# Patient Record
Sex: Female | Born: 1985 | Race: White | Hispanic: No | Marital: Married | Smoking: Never smoker
Health system: Southern US, Community
[De-identification: ages and names within clinical notes are randomized; demographics above are authoritative.]

## PROBLEM LIST (undated history)

## (undated) DIAGNOSIS — N921 Excessive and frequent menstruation with irregular cycle: Secondary | ICD-10-CM

## (undated) DIAGNOSIS — K219 Gastro-esophageal reflux disease without esophagitis: Secondary | ICD-10-CM

## (undated) DIAGNOSIS — J45909 Unspecified asthma, uncomplicated: Secondary | ICD-10-CM

## (undated) DIAGNOSIS — N926 Irregular menstruation, unspecified: Secondary | ICD-10-CM

## (undated) DIAGNOSIS — Z8619 Personal history of other infectious and parasitic diseases: Secondary | ICD-10-CM

## (undated) HISTORY — PX: DILATION AND CURETTAGE OF UTERUS: SHX78

## (undated) HISTORY — PX: TONSILLECTOMY AND ADENOIDECTOMY: SHX28

## (undated) HISTORY — DX: Personal history of other infectious and parasitic diseases: Z86.19

## (undated) HISTORY — PX: UPPER GI ENDOSCOPY: SHX6162

## (undated) HISTORY — DX: Irregular menstruation, unspecified: N92.6

## (undated) HISTORY — DX: Excessive and frequent menstruation with irregular cycle: N92.1

## (undated) HISTORY — PX: TONSILLECTOMY: SUR1361

---
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
## 2013-05-06 ENCOUNTER — Ambulatory Visit (INDEPENDENT_AMBULATORY_CARE_PROVIDER_SITE_OTHER): Payer: BC Managed Care – PPO | Admitting: Gynecology

## 2013-05-06 ENCOUNTER — Encounter: Payer: Self-pay | Admitting: Gynecology

## 2013-05-06 VITALS — BP 94/60 | Resp 14 | Ht 64.0 in | Wt 126.0 lb

## 2013-05-06 DIAGNOSIS — Z01419 Encounter for gynecological examination (general) (routine) without abnormal findings: Secondary | ICD-10-CM

## 2013-05-06 DIAGNOSIS — Z124 Encounter for screening for malignant neoplasm of cervix: Secondary | ICD-10-CM

## 2013-05-06 DIAGNOSIS — Z0000 Encounter for general adult medical examination without abnormal findings: Secondary | ICD-10-CM

## 2013-05-06 DIAGNOSIS — N926 Irregular menstruation, unspecified: Secondary | ICD-10-CM

## 2013-05-06 HISTORY — DX: Irregular menstruation, unspecified: N92.6

## 2013-05-06 LAB — POCT URINALYSIS DIPSTICK
Leukocytes, UA: NEGATIVE
RBC UA: 2
Urobilinogen, UA: NEGATIVE
pH, UA: 5

## 2013-05-06 LAB — HEMOGLOBIN, FINGERSTICK: Hemoglobin, fingerstick: 12.5 g/dL (ref 12.0–16.0)

## 2013-05-06 LAB — TSH: TSH: 1.854 u[IU]/mL (ref 0.350–4.500)

## 2013-05-06 NOTE — Patient Instructions (Signed)

## 2013-05-06 NOTE — Progress Notes (Signed)
28 y.o. Married Caucasian female   [id number] here for annual exam. Pt is  currently sexually active.  She reports not using contraception..  First sexual activity at 28 years old, 3 number of lifetime partners.   Pt has been married for 4314m, they would like to conceive in the upcoming months.  Pt reports that her cycles are irratic with either short cycles or break thru bleeding-range is 12-34 days, bleeding is 5d super tampon every 2.5h for 1d, regular for 5h for next 4d.  No clots.  Pt was on DepoProvera for 1530m, last shot 9/13.    Patient's last menstrual period was 05/05/2013.          Sexually active: yes  The current method of family planning is none.    Exercising: no  The patient does not participate in regular exercise at present. Last pap:  More than 2 years WNL  Alcohol:  Maybe 1/wk  Tobacco: no Drugs: no Gardisil: no, completed:  Hgb: 12.5 ; Urine: Blood 2 (menses) , Trace Protein   No health maintenance topics applied.  Family History  Problem Relation Age of Onset  . Melanoma Maternal Grandfather   . Skin cancer Paternal Grandfather   . Skin cancer Paternal Grandmother   . Breast cancer Maternal Aunt 54  . Diabetes Mellitus II Mother   . Fibroids Mother   . Hypertension Mother   . Diabetes Mellitus II Maternal Grandmother   . Heart attack Maternal Grandmother   . Osteoarthritis Maternal Grandmother   . Diabetes Mellitus I Paternal Aunt     There are no active problems to display for this patient.   History reviewed. No pertinent past medical history.  Past Surgical History  Procedure Laterality Date  . Tonsillectomy and adenoidectomy  Age 70   . Wisdom tooth extraction  2003    Allergies: Review of patient's allergies indicates no known allergies.  Current Outpatient Prescriptions  Medication Sig Dispense Refill  . Acetaminophen (TYLENOL PO) Take by mouth as needed.       No current facility-administered medications for this visit.    ROS: Pertinent items  are noted in HPI.  Exam:    BP 94/60  Resp 14  Ht 5\' 4"  (1.626 m)  Wt 126 lb (57.153 kg)  BMI 21.62 kg/m2  LMP 05/05/2013 Weight change: @WEIGHTCHANGE @ Last 3 height recordings:  Ht Readings from Last 3 Encounters:  05/06/13 5\' 4"  (1.626 m)   General appearance: alert, cooperative and appears stated age Head: Normocephalic, without obvious abnormality, atraumatic Neck: no adenopathy, no carotid bruit, no JVD, supple, symmetrical, trachea midline and thyroid not enlarged, symmetric, no tenderness/mass/nodules Lungs: clear to auscultation bilaterally Breasts: normal appearance, no masses or tenderness Heart: regular rate and rhythm, S1, S2 normal, no murmur, click, rub or gallop Abdomen: soft, non-tender; bowel sounds normal; no masses,  no organomegaly Extremities: extremities normal, atraumatic, no cyanosis or edema Skin: Skin color, texture, turgor normal. No rashes or lesions Lymph nodes: Cervical, supraclavicular, and axillary nodes normal. no inguinal nodes palpated Neurologic: Grossly normal   Pelvic: External genitalia:  no lesions              Urethra: normal appearing urethra with no masses, tenderness or lesions              Bartholins and Skenes: Bartholin's, Urethra, Skene's normal                 Vagina: heavy menstrum  Cervix: normal appearance              Pap taken: yes        Bimanual Exam:  Uterus:  uterus is normal size, shape, consistency and nontender                                      Adnexa:    normal adnexa in size, nontender and no masses                                      Rectovaginal: Confirms                                      Anus:  normal sphincter tone, no lesions  A: well woman Irregular menses     P: pap smear with reflex Discussed at length causes of irregular menses, pt had irregluar cycles before depo.  Discussed PCOS and treatment, she is day 3 so we will check FSH/LH ratio and if abnormal will get fasting insulin and  glucose.  TSH also done.  Pt is not interested in actively pursuing pregnancy att his time counseled on breast self exam, adequate intake of calcium and vitamin D, diet and exercise return annually or prn Discussed STD prevention, regular condom use.     An After Visit Summary was printed and given to the patient.

## 2013-05-07 ENCOUNTER — Telehealth: Payer: Self-pay | Admitting: *Deleted

## 2013-05-07 LAB — FSH/LH
FSH: 5.6 m[IU]/mL
LH: 3.8 m[IU]/mL

## 2013-05-07 LAB — RUBELLA SCREEN: RUBELLA: 1.65 {index} — AB (ref <0.90)

## 2013-05-07 NOTE — Telephone Encounter (Signed)
Message copied by Lorraine LaxSHAW, Naureen Benton J on Wed May 07, 2013 12:21 PM ------      Message from: Douglass RiversLATHROP, TRACY      Created: Wed May 07, 2013 11:10 AM       Rubella is immune, thyroid normal, FSH/LH ratio is good and not c/w PCOS, sent mychart.      Pt should make appt to discuss management of irregular cycles, pls call ------

## 2013-05-07 NOTE — Telephone Encounter (Signed)
Patient notified see result note 

## 2013-05-07 NOTE — Telephone Encounter (Signed)
Left Message To Call Back  

## 2013-05-07 NOTE — Telephone Encounter (Signed)
Return call to Jasmine. °

## 2013-05-08 LAB — IPS PAP TEST WITH REFLEX TO HPV

## 2013-05-14 ENCOUNTER — Ambulatory Visit (INDEPENDENT_AMBULATORY_CARE_PROVIDER_SITE_OTHER): Payer: BC Managed Care – PPO | Admitting: Gynecology

## 2013-05-14 ENCOUNTER — Encounter: Payer: Self-pay | Admitting: Gynecology

## 2013-05-14 VITALS — BP 100/68 | Resp 18 | Ht 64.0 in | Wt 125.0 lb

## 2013-05-14 DIAGNOSIS — N926 Irregular menstruation, unspecified: Secondary | ICD-10-CM

## 2013-05-14 DIAGNOSIS — N921 Excessive and frequent menstruation with irregular cycle: Secondary | ICD-10-CM

## 2013-05-14 DIAGNOSIS — N920 Excessive and frequent menstruation with regular cycle: Secondary | ICD-10-CM

## 2013-05-14 NOTE — Progress Notes (Signed)
Pt here to discuss treatment and further evaluation of her irregular cycles.  Pt varified that she can bleed irratically every 13-38d.  She can bleed heavy changing tampons every hour to spotting.  She is unable to plan activities due to their irregularity. Pt had FSH/LH  And TSH done day 3 which were normal ratio.  She has been off Depo for over 1y and.  Pt is not interested in conception now but she is recently married.   We offered her ocp to regulate with the idea that she may have a normal cycle after coming off for a few months or can be stimulated if need be.  pt had been on ocp in past but was not interested in restarting at this time. We suggested proceeding with an u/s and SHG to rule out any anatomic defects that may be contributing-she is agreeable Pt denies any other issues with bleeding such as bleeding gums, bruising or slow to clot after cuts. Questions addressed 5141m spent discussing menometrrrhagia, >50% face to face

## 2013-05-15 ENCOUNTER — Telehealth: Payer: Self-pay | Admitting: Gynecology

## 2013-05-15 NOTE — Telephone Encounter (Signed)
Spoke with patient. Advised [facility]. Scheduled [facility].Marland Kitchen.patient is going to confirm with her employer that the appointment is ok and will call back tomorrow if anything changes.

## 2013-05-15 NOTE — Telephone Encounter (Signed)
Left message for patient to call back. Need to go over benefits and schedule [facility]GM. °

## 2013-05-15 NOTE — Telephone Encounter (Signed)
Patient returning Kristina [name]'s call but will try back at 4 PM.

## 2013-05-19 NOTE — Telephone Encounter (Signed)
[facility]. Asked patient to call back to reschedule.

## 2013-05-19 NOTE — Telephone Encounter (Signed)
Sp[facility] with patient. [facility]

## 2013-05-20 ENCOUNTER — Other Ambulatory Visit: Payer: BC Managed Care – PPO

## 2013-05-20 ENCOUNTER — Other Ambulatory Visit: Payer: BC Managed Care – PPO | Admitting: Gynecology

## 2013-05-27 ENCOUNTER — Other Ambulatory Visit: Payer: Self-pay | Admitting: Gynecology

## 2013-05-27 ENCOUNTER — Encounter: Payer: Self-pay | Admitting: Gynecology

## 2013-05-27 ENCOUNTER — Ambulatory Visit (INDEPENDENT_AMBULATORY_CARE_PROVIDER_SITE_OTHER): Payer: BC Managed Care – PPO | Admitting: Gynecology

## 2013-05-27 ENCOUNTER — Ambulatory Visit (INDEPENDENT_AMBULATORY_CARE_PROVIDER_SITE_OTHER): Payer: BC Managed Care – PPO

## 2013-05-27 VITALS — BP 92/60 | HR 64 | Resp 16 | Ht 64.0 in | Wt 125.0 lb

## 2013-05-27 DIAGNOSIS — N926 Irregular menstruation, unspecified: Secondary | ICD-10-CM

## 2013-05-27 DIAGNOSIS — N921 Excessive and frequent menstruation with irregular cycle: Secondary | ICD-10-CM

## 2013-05-27 DIAGNOSIS — N920 Excessive and frequent menstruation with regular cycle: Secondary | ICD-10-CM

## 2013-05-27 NOTE — Progress Notes (Signed)
     Pt here for PUS and SHG to evaluate for menorrhagia.  Pt is day 23 of cycle and is not using contraception.  Therefore SHG not done today, explained to pt who is agreeable.  Images reviewed.  Uterus is normal, no fibroids noted.  EMS 6.2, smooth without any distortion.  There is a dominant appearing follicle on the left at 17mm and right is normal.  No free fliud. Suggest when she and husband are ready that we stimulate with clomid.  I would suggest they return as a couple to review.  She is agreeable.  She is not interested in try ocp in the meantime. 4482m spent discussing menorrhagia and fertility, >50% face to face

## 2013-06-29 ENCOUNTER — Encounter: Payer: Self-pay | Admitting: Gynecology

## 2013-06-30 NOTE — Telephone Encounter (Signed)
You should come in for consult, if possible with your husband, when was your last cycle?  Do you remember when you bled in April, was looking at u/s and there was a follicle.Marland Kitchen

## 2013-07-02 ENCOUNTER — Other Ambulatory Visit: Payer: Self-pay | Admitting: Gynecology

## 2013-07-02 DIAGNOSIS — N926 Irregular menstruation, unspecified: Secondary | ICD-10-CM

## 2013-07-16 ENCOUNTER — Ambulatory Visit (INDEPENDENT_AMBULATORY_CARE_PROVIDER_SITE_OTHER): Payer: BC Managed Care – PPO | Admitting: Gynecology

## 2013-07-16 VITALS — BP 100/60 | Resp 14 | Ht 64.0 in | Wt 124.0 lb

## 2013-07-16 DIAGNOSIS — N970 Female infertility associated with anovulation: Secondary | ICD-10-CM

## 2013-07-16 MED ORDER — CLOMIPHENE CITRATE 50 MG PO TABS: 50.0000 mg | ORAL_TABLET | Freq: Every day | ORAL | Status: DC

## 2013-07-16 NOTE — Patient Instructions (Signed)
Clomid day 5-9/ cycle Expect ovulation d16-21 menses day 30-35 Sex every 2-3d Avoid alcohol, hot tubs, NSAIDS

## 2013-07-16 NOTE — Progress Notes (Signed)
Pt comes in to discuss getting pregnant.  Pt has had normal day 3 FSH/LH, TSH and a normal PUS.  She did not have a progesterone level done, or an EMB as her PUS was day 23 and no contraception. Progesterone to be timed with flow. Pt was asked to bring in her most recent cycles for charting. Pt reports cycle irregularity with variable flow every 12-34d.   Review of cycles since January shows flow 5-8d, menses 14-35d, occasional mid-cycle spotting at20d  Husband: 30yo, HTN 15y-benapril/HCTZ  Hypothyroid: 12y- synthroid 100mcg  DM: just dx- metformin 1000mg  BID  No children/pregnancy  No issues with ED   We discussed stimulating ovulation with clomid, instructed how to time cycle to take, coital frequency, timing of ovulation. Pt and husband will not be together most of July, will start in August. Clomid  #5, no refill given Instructed to call with cycle or if no cycle, pregnancy test at d35 Questions addressed 7678m spent counseling, >50%  Face to face

## 2013-08-07 ENCOUNTER — Telehealth: Payer: Self-pay | Admitting: Gynecology

## 2013-08-07 LAB — US OB COMP LESS 14 WKS

## 2013-08-07 NOTE — Telephone Encounter (Signed)
Patient would like an appointment to confirm pregnancy.

## 2013-08-07 NOTE — Telephone Encounter (Signed)
OV with Dr lathrop week of 08-25-13 is not in place of the recommendation for evaluation now as recommended by Dr Edward JollySilva. Patient should be seen for evaluation, if cant come in, ok to be seen locally and then we are happy to see her the week of 08-25-13 when [place] is back in [place].

## 2013-08-07 NOTE — Telephone Encounter (Signed)
Spoke with patient. Advised of message as seen below from Dr.Silva. Patient states that [place] in [place] and if she leaves she will not get credit and she already paid for the class. Advised patient that I understand but her health is very important and this is something that needs to be assessed. Advised this needs to be assessed today. Patient is agreeable. "I just don't think that I could make it there before you close today or tomorrow." Advised patient needs to be seen today at local ER. Patient is agreeable.  Advised this is something that she should not let go without being seen. Patient states that she will be seen today locally.   Routing to Dr.Silva as covering Cc: Dr.Lathrop  Will keep in inbox to check on patient tomorrow to make sure she was seen.

## 2013-08-07 NOTE — Telephone Encounter (Signed)
Spoke with patient. Patient would like to come in to confirm pregnancy. Took pregnancy test yesterday that was positive.Patient requesting appointment for the week of July 27th as she is in school. Advised patient Dr.Lathrop is out of the office that week but could get her in to see another provider. Patient is agreeable. Appointment scheduled for July 27th at 9:15am with Verner Choleborah S. Leonard CNM. Patient agreeable to date and time. Patient states that she has been having "cramping" that has been going on for a while "Its been going on since even before I thought I could be pregnant." Cramping is central in the abdominal area. Patient denies sharp pain and bleeding."It is a cramping that just happens occasionally and then goes away." Advised patient would send a message over to the covering provider as Dr.Lathrop is off today and would give patient a call back with further recommendations. Patient agreeable.  Dr.Silva, patient has been on Clomid 50mg  and was last seen by Dr.Lathrop on 6/17 see encounter. Please advise.  Cc:Dr.Lathrop

## 2013-08-07 NOTE — Telephone Encounter (Signed)
Dr Farrel GobbleLathrop called in, would like to see patient the week of 7-27 if possible.  Will try to schedule appointment on  08-26-13. Will know about appointment date when we check on patient tomorrow.

## 2013-08-07 NOTE — Telephone Encounter (Signed)
I agree with the plan.

## 2013-08-07 NOTE — Telephone Encounter (Signed)
Patient needs appointment and possible ultrasound. Please work her in for today.

## 2013-08-08 NOTE — Telephone Encounter (Signed)
Spoke with patient. Patient states that she was seen yesterday for an ultrasound and blood work and everything was okay. Will have ultrasound and blood work sent to Dr.Lathrop for review. Appointment scheduled with Dr.Lathrop for 7/28 at 10:30am. Agreeable to date and time.  Routing to provider for final review. Patient agreeable to disposition. Will close encounter

## 2013-08-19 ENCOUNTER — Telehealth: Payer: Self-pay | Admitting: *Deleted

## 2013-08-19 DIAGNOSIS — N912 Amenorrhea, unspecified: Secondary | ICD-10-CM

## 2013-08-19 NOTE — Telephone Encounter (Signed)
Call to patient to adjust appointment time to afternoon appointment so that ulltrasound is available if needed. Appt with Kristina [name] at 12 and possible PUS 1230.  Routing to provider for final review. Patient agreeable to disposition. Will close encounter

## 2013-08-25 ENCOUNTER — Ambulatory Visit: Payer: BC Managed Care – PPO | Admitting: Certified Nurse Midwife

## 2013-08-26 ENCOUNTER — Other Ambulatory Visit: Payer: Self-pay | Admitting: Gynecology

## 2013-08-26 ENCOUNTER — Ambulatory Visit: Payer: BC Managed Care – PPO | Admitting: Gynecology

## 2013-08-26 ENCOUNTER — Encounter: Payer: Self-pay | Admitting: Gynecology

## 2013-08-26 ENCOUNTER — Ambulatory Visit (INDEPENDENT_AMBULATORY_CARE_PROVIDER_SITE_OTHER): Payer: BC Managed Care – PPO | Admitting: Gynecology

## 2013-08-26 ENCOUNTER — Ambulatory Visit (INDEPENDENT_AMBULATORY_CARE_PROVIDER_SITE_OTHER): Payer: BC Managed Care – PPO

## 2013-08-26 VITALS — BP 124/62 | HR 60 | Ht 64.0 in | Wt 125.0 lb

## 2013-08-26 DIAGNOSIS — N912 Amenorrhea, unspecified: Secondary | ICD-10-CM

## 2013-08-26 DIAGNOSIS — O3680X0 Pregnancy with inconclusive fetal viability, not applicable or unspecified: Secondary | ICD-10-CM

## 2013-08-26 DIAGNOSIS — O021 Missed abortion: Secondary | ICD-10-CM

## 2013-08-26 LAB — US OB TRANSVAGINAL

## 2013-08-26 LAB — POCT URINE PREGNANCY: Preg Test, Ur: POSITIVE

## 2013-08-26 NOTE — Patient Instructions (Signed)
Incomplete Miscarriage A miscarriage is the sudden loss of an unborn baby (fetus) before the 20th week of pregnancy. In an incomplete miscarriage, parts of the fetus or placenta (afterbirth) remain in the body.  Having a miscarriage can be an emotional experience. Talk with your health care provider about any questions you may have about miscarrying, the grieving process, and your future pregnancy plans. CAUSES   Problems with the fetal chromosomes that make it impossible for the baby to develop normally. Problems with the baby's genes or chromosomes are most often the result of errors that occur by chance as the embryo divides and grows. The problems are not inherited from the parents.  Infection of the cervix or uterus.  Hormone problems.  Problems with the cervix, such as having an incompetent cervix. This is when the tissue in the cervix is not strong enough to hold the pregnancy.  Problems with the uterus, such as an abnormally shaped uterus, uterine fibroids, or congenital abnormalities.  Certain medical conditions.  Smoking, drinking alcohol, or taking illegal drugs.  Trauma. SYMPTOMS   Vaginal bleeding or spotting, with or without cramps or pain.  Pain or cramping in the abdomen or lower back.  Passing fluid, tissue, or blood clots from the vagina. DIAGNOSIS  Your health care provider will perform a physical exam. You may also have an ultrasound to confirm the miscarriage. Blood or urine tests may also be ordered. TREATMENT   Usually, a dilation and curettage (D&C) procedure is performed. During a D&C procedure, the cervix is widened (dilated) and any remaining fetal or placental tissue is gently removed from the uterus.  Antibiotic medicines are prescribed if there is an infection. Other medicines may be given to reduce the size of the uterus (contract) if there is a lot of bleeding.  If you have Rh negative blood and your baby was Rh positive, you will need a Rho (D)  immune globulin shot. This shot will protect any future baby from having Rh blood problems in future pregnancies.  You may be confined to bed rest. This means you should stay in bed and only get up to use the bathroom. HOME CARE INSTRUCTIONS   Rest as directed by your health care provider.  Restrict activity as directed by your health care provider. You may be allowed to continue light activity if curettage was not done but you require further treatment.  Keep track of the number of pads you use each day. Keep track of how soaked (saturated) they are. Record this information.  Do not  use tampons.  Do not douche or have sexual intercourse until approved by your health care provider.  Keep all follow-up appointments for reevaluation and continuing management.  Only take over-the-counter or prescription medicines for pain, fever, or discomfort as directed by your health care provider.  Take antibiotic medicine as directed by your health care provider. Make sure you finish it even if you start to feel better. SEEK IMMEDIATE MEDICAL CARE IF:   You experience severe cramps in your stomach, back, or abdomen.  You have an unexplained temperature (make sure to record these temperatures).  You pass large clots or tissue (save these for your health care provider to inspect).  Your bleeding increases.  You become light-headed, weak, or have fainting episodes. MAKE SURE YOU:   Understand these instructions.  Will watch your condition.  Will get help right away if you are not doing well or get worse. Document Released: 01/16/2005 Document Revised: 06/02/2013 Document Reviewed:   08/15/2012 ExitCare Patient Information 2015 [employer]. This information is not intended to replace advice given to you by your health care provider. Make sure you discuss any questions you have with your health care provider.  

## 2013-08-26 NOTE — Progress Notes (Signed)
Pt here after spontaneously conceiving,   [place] was [place] in [place], after she had some cramping and had a viability u/s where a yolk sca but no fetal heart was  Noted. No bleeding, cramping improved, some nausea no vomiting.no bleeding. Pt and husband are pleased. LMP: 07/21/13     PUS images reviewed with pt and husband.  Despite normal appearing GS, yolk sac and s=d, no fetal heart tones were seen on today's exam despite multiple attempts. Both are visibly upset. Discussed miscarriage frequency, etiology and likelihood to recur. Discussed suspected proximity of fetal loss to PUS.  Encourage them to grieve and when ready can use clomid to attempt pregnancy in future.  discussed D&C with suction vs expectant management and risks of bleeding, cervical damage and uterine perforation were reviewed. They are unsure about what they want to do, we will contact them later this week  Questions addressed Support given ABO Rh, BHCG today 226m spent counseling, >50% face to face

## 2013-08-27 ENCOUNTER — Telehealth: Payer: Self-pay | Admitting: Gynecology

## 2013-08-27 ENCOUNTER — Other Ambulatory Visit (INDEPENDENT_AMBULATORY_CARE_PROVIDER_SITE_OTHER): Payer: BC Managed Care – PPO

## 2013-08-27 DIAGNOSIS — O021 Missed abortion: Secondary | ICD-10-CM

## 2013-08-27 DIAGNOSIS — N926 Irregular menstruation, unspecified: Secondary | ICD-10-CM

## 2013-08-27 LAB — ABO AND RH: Rh Type: POSITIVE

## 2013-08-27 LAB — HCG, QUANTITATIVE, PREGNANCY: hCG, Beta Chain, Quant, S: 21176.8 m[IU]/mL

## 2013-08-27 NOTE — Addendum Note (Signed)
Addended by: Lorraine LaxSHAW, Ehan Freas J on: 08/27/2013 10:03 AM   Modules accepted: Orders

## 2013-08-27 NOTE — Telephone Encounter (Signed)
Pt informed regarding labs, +rh, BHCG 21k, pt denies any bleeding or cramping.  Pt would like a repeat hcg to see the trend before abandoning this pregnancy.  Will drop order. Pt undecided re D&C at this time

## 2013-08-28 ENCOUNTER — Telehealth: Payer: Self-pay | Admitting: Gynecology

## 2013-08-28 LAB — HCG, QUANTITATIVE, PREGNANCY: hCG, Beta Chain, Quant, S: 20294.5 m[IU]/mL

## 2013-08-28 NOTE — Telephone Encounter (Signed)
Pt informed that HCG is declining c/w MAB.  They are unsure if they want to have a D&C or not, Pt states that she noticed a small amount of dark blood without cramping today.  She was advised to call if she has bleeding and/or cramping.   Questions addressed.

## 2013-08-29 ENCOUNTER — Telehealth: Payer: Self-pay | Admitting: Gynecology

## 2013-08-29 ENCOUNTER — Encounter: Payer: Self-pay | Admitting: Emergency Medicine

## 2013-08-29 ENCOUNTER — Encounter (HOSPITAL_COMMUNITY): Payer: Self-pay

## 2013-08-29 MED ORDER — MISOPROSTOL 200 MCG PO TABS: ORAL_TABLET | ORAL | Status: DC

## 2013-08-29 NOTE — Telephone Encounter (Signed)
Patient calling to speak with nurse about, "What I have decided in terms of miscarriage."

## 2013-08-29 NOTE — Telephone Encounter (Signed)
Spoke with patient. Patient states that she would like to have a D&C and was calling to let Dr.Lathrop know. Advised I would let Dr.Lathrop know and that someone in the office would be in contact with her in regards of setting that up. Patient is agreeable.

## 2013-08-29 NOTE — Telephone Encounter (Signed)
Surgery Information Form  Your surgery is scheduled at: [facility] Date: 09/01/13  Time: 2:45  Arrive at 1315     You can take Tylenol or acetaminophen for any discomfort.  Do not take any  other pain medications unless approved by your physician.  You may continue  your regular multi-vitamin.  3. Please complete the following bowel prep     No bowel prep needed  4. DO NOT EAT OR DRINK ANYTHING AFTER MIDNIGHT ON THE NIGHT   BEFORE YOUR SURGERY (INCLUDING WATER) UNLESS ADVISED  TO DO SO BY THE HOSPITAL OR YOUR PHYSICIAN   5. Dress casually the morning of surgery.  Do not take any valuables with you and   do not wear makeup, jewelry or lotion.  You should shave 48 hours prior to   Surgery. No Jewelry at all.   6. You will have a post operative appointment with your physician   Date: 08/12/13 Time: 1100  7. If you have any questions, please call our office [phone] and ask   for the surgical nurse.   The surgical nurse is available Monday-Friday 9:00 am-5:00 pm

## 2013-09-01 ENCOUNTER — Ambulatory Visit (HOSPITAL_COMMUNITY): Payer: BC Managed Care – PPO | Admitting: Anesthesiology

## 2013-09-01 ENCOUNTER — Encounter (HOSPITAL_COMMUNITY)
Admission: AD | Disposition: A | Discharge status: Home / Self Care | Payer: Self-pay | Source: Ambulatory Visit | Attending: Gynecology

## 2013-09-01 ENCOUNTER — Encounter (HOSPITAL_COMMUNITY): Payer: BC Managed Care – PPO | Admitting: Anesthesiology

## 2013-09-01 ENCOUNTER — Ambulatory Visit (HOSPITAL_COMMUNITY)
Admission: AD | Admit: 2013-09-01 | Discharge: 2013-09-01 | Disposition: A | Discharge status: Home / Self Care | Payer: BC Managed Care – PPO | Source: Ambulatory Visit | Attending: Gynecology | Admitting: Gynecology

## 2013-09-01 ENCOUNTER — Encounter (HOSPITAL_COMMUNITY): Payer: Self-pay | Admitting: *Deleted

## 2013-09-01 DIAGNOSIS — O021 Missed abortion: Secondary | ICD-10-CM | POA: Insufficient documentation

## 2013-09-01 DIAGNOSIS — Z8249 Family history of ischemic heart disease and other diseases of the circulatory system: Secondary | ICD-10-CM | POA: Insufficient documentation

## 2013-09-01 DIAGNOSIS — K219 Gastro-esophageal reflux disease without esophagitis: Secondary | ICD-10-CM | POA: Insufficient documentation

## 2013-09-01 DIAGNOSIS — J45909 Unspecified asthma, uncomplicated: Secondary | ICD-10-CM | POA: Insufficient documentation

## 2013-09-01 DIAGNOSIS — Z833 Family history of diabetes mellitus: Secondary | ICD-10-CM | POA: Insufficient documentation

## 2013-09-01 DIAGNOSIS — N920 Excessive and frequent menstruation with regular cycle: Secondary | ICD-10-CM | POA: Insufficient documentation

## 2013-09-01 DIAGNOSIS — N926 Irregular menstruation, unspecified: Secondary | ICD-10-CM | POA: Insufficient documentation

## 2013-09-01 HISTORY — DX: Gastro-esophageal reflux disease without esophagitis: K21.9

## 2013-09-01 HISTORY — PX: DILATION AND EVACUATION: SHX1459

## 2013-09-01 HISTORY — DX: Unspecified asthma, uncomplicated: J45.909

## 2013-09-01 LAB — CBC
HCT: 34.8 % — ABNORMAL LOW (ref 36.0–46.0)
Hemoglobin: 12.3 g/dL (ref 12.0–15.0)
MCH: 29.9 pg (ref 26.0–34.0)
MCHC: 35.3 g/dL (ref 30.0–36.0)
MCV: 84.5 fL (ref 78.0–100.0)
PLATELETS: 160 10*3/uL (ref 150–400)
RBC: 4.12 MIL/uL (ref 3.87–5.11)
RDW: 12.9 % (ref 11.5–15.5)
WBC: 7.5 10*3/uL (ref 4.0–10.5)

## 2013-09-01 SURGERY — DILATION AND EVACUATION, UTERUS
Anesthesia: Monitor Anesthesia Care | Site: Uterus

## 2013-09-01 MED ORDER — BUPIVACAINE-EPINEPHRINE (PF) 0.25% -1:200000 IJ SOLN
INTRAMUSCULAR | Status: AC
  Filled 2013-09-01: qty 30

## 2013-09-01 MED ORDER — SCOPOLAMINE 1 MG/3DAYS TD PT72
1.0000 | MEDICATED_PATCH | Freq: Once | TRANSDERMAL | Status: DC
  Administered 2013-09-01: 1.5 mg via TRANSDERMAL

## 2013-09-01 MED ORDER — MIDAZOLAM HCL 2 MG/2ML IJ SOLN
INTRAMUSCULAR | Status: DC | PRN
  Administered 2013-09-01: 2 mg via INTRAVENOUS

## 2013-09-01 MED ORDER — BUPIVACAINE-EPINEPHRINE 0.25% -1:200000 IJ SOLN
INTRAMUSCULAR | Status: DC | PRN
  Administered 2013-09-01: 30 mL

## 2013-09-01 MED ORDER — DEXTROSE 5 % IV SOLN
100.0000 mg | Freq: Once | INTRAVENOUS | Status: AC
  Administered 2013-09-01: 100 mg via INTRAVENOUS
  Filled 2013-09-01 (×2): qty 100

## 2013-09-01 MED ORDER — METHYLERGONOVINE MALEATE 0.2 MG/ML IJ SOLN
INTRAMUSCULAR | Status: AC
  Filled 2013-09-01: qty 1

## 2013-09-01 MED ORDER — DEXAMETHASONE SODIUM PHOSPHATE 10 MG/ML IJ SOLN
INTRAMUSCULAR | Status: DC | PRN
  Administered 2013-09-01: 10 mg via INTRAVENOUS

## 2013-09-01 MED ORDER — PROMETHAZINE HCL 25 MG/ML IJ SOLN: 6.2500 mg | INTRAMUSCULAR | Status: DC | PRN

## 2013-09-01 MED ORDER — MIDAZOLAM HCL 2 MG/2ML IJ SOLN: 0.5000 mg | Freq: Once | INTRAMUSCULAR | Status: DC | PRN

## 2013-09-01 MED ORDER — LIDOCAINE HCL (CARDIAC) 20 MG/ML IV SOLN
INTRAVENOUS | Status: DC | PRN
  Administered 2013-09-01: 50 mg via INTRAVENOUS

## 2013-09-01 MED ORDER — DEXAMETHASONE SODIUM PHOSPHATE 4 MG/ML IJ SOLN
INTRAMUSCULAR | Status: AC
  Filled 2013-09-01: qty 1

## 2013-09-01 MED ORDER — METHYLERGONOVINE MALEATE 0.2 MG PO TABS: 0.2000 mg | ORAL_TABLET | Freq: Four times a day (QID) | ORAL | Status: DC

## 2013-09-01 MED ORDER — PROPOFOL 10 MG/ML IV EMUL
INTRAVENOUS | Status: AC
  Filled 2013-09-01: qty 20

## 2013-09-01 MED ORDER — ONDANSETRON HCL 4 MG/2ML IJ SOLN
INTRAMUSCULAR | Status: AC
  Filled 2013-09-01: qty 2

## 2013-09-01 MED ORDER — KETOROLAC TROMETHAMINE 30 MG/ML IJ SOLN
INTRAMUSCULAR | Status: DC | PRN
  Administered 2013-09-01: 30 mg via INTRAVENOUS

## 2013-09-01 MED ORDER — LIDOCAINE HCL 2 % IJ SOLN
INTRAMUSCULAR | Status: DC | PRN
  Administered 2013-09-01: 20 mL

## 2013-09-01 MED ORDER — DIPHENHYDRAMINE HCL 50 MG/ML IJ SOLN
INTRAMUSCULAR | Status: AC
  Filled 2013-09-01: qty 1

## 2013-09-01 MED ORDER — LIDOCAINE HCL (CARDIAC) 20 MG/ML IV SOLN
INTRAVENOUS | Status: AC
  Filled 2013-09-01: qty 5

## 2013-09-01 MED ORDER — DIPHENHYDRAMINE HCL 50 MG/ML IJ SOLN
INTRAMUSCULAR | Status: DC | PRN
  Administered 2013-09-01: 12.5 mg via INTRAVENOUS

## 2013-09-01 MED ORDER — FENTANYL CITRATE 0.05 MG/ML IJ SOLN
INTRAMUSCULAR | Status: AC
  Filled 2013-09-01: qty 2

## 2013-09-01 MED ORDER — MIDAZOLAM HCL 2 MG/2ML IJ SOLN
INTRAMUSCULAR | Status: AC
  Filled 2013-09-01: qty 2

## 2013-09-01 MED ORDER — LACTATED RINGERS IV SOLN
INTRAVENOUS | Status: DC
  Administered 2013-09-01 (×2): via INTRAVENOUS

## 2013-09-01 MED ORDER — MEPERIDINE HCL 25 MG/ML IJ SOLN: 6.2500 mg | INTRAMUSCULAR | Status: DC | PRN

## 2013-09-01 MED ORDER — KETOROLAC TROMETHAMINE 30 MG/ML IJ SOLN: 15.0000 mg | Freq: Once | INTRAMUSCULAR | Status: DC | PRN

## 2013-09-01 MED ORDER — LACTATED RINGERS IV SOLN
INTRAVENOUS | Status: DC
  Administered 2013-09-01: 14:00:00 via INTRAVENOUS

## 2013-09-01 MED ORDER — FENTANYL CITRATE 0.05 MG/ML IJ SOLN: 25.0000 ug | INTRAMUSCULAR | Status: DC | PRN

## 2013-09-01 MED ORDER — SCOPOLAMINE 1 MG/3DAYS TD PT72
MEDICATED_PATCH | TRANSDERMAL | Status: AC
  Administered 2013-09-01: 1.5 mg via TRANSDERMAL
  Filled 2013-09-01: qty 1

## 2013-09-01 MED ORDER — PROPOFOL 10 MG/ML IV BOLUS
INTRAVENOUS | Status: DC | PRN
  Administered 2013-09-01 (×6): 20 mg via INTRAVENOUS

## 2013-09-01 MED ORDER — FENTANYL CITRATE 0.05 MG/ML IJ SOLN
INTRAMUSCULAR | Status: DC | PRN
  Administered 2013-09-01: 100 ug via INTRAVENOUS

## 2013-09-01 MED ORDER — ONDANSETRON HCL 4 MG/2ML IJ SOLN
INTRAMUSCULAR | Status: DC | PRN
  Administered 2013-09-01: 4 mg via INTRAVENOUS

## 2013-09-01 MED ORDER — LIDOCAINE HCL 2 % IJ SOLN
INTRAMUSCULAR | Status: AC
  Filled 2013-09-01: qty 20

## 2013-09-01 SURGICAL SUPPLY — 18 items
CONTAINER PREFILL 10% NBF 60ML (FORM) IMPLANT
DECANTER SPIKE VIAL GLASS SM (MISCELLANEOUS) ×3 IMPLANT
GLOVE BIOGEL M 6.5 STRL (GLOVE) ×3 IMPLANT
GLOVE BIOGEL PI IND STRL 6.5 (GLOVE) ×1 IMPLANT
GLOVE BIOGEL PI INDICATOR 6.5 (GLOVE) ×2
GOWN STRL REUS W/TWL LRG LVL3 (GOWN DISPOSABLE) ×6 IMPLANT
KIT BERKELEY 1ST TRIMESTER 3/8 (MISCELLANEOUS) ×3 IMPLANT
NS IRRIG 1000ML POUR BTL (IV SOLUTION) ×3 IMPLANT
PACK VAGINAL MINOR WOMEN LF (CUSTOM PROCEDURE TRAY) ×3 IMPLANT
PAD OB MATERNITY 4.3X12.25 (PERSONAL CARE ITEMS) ×3 IMPLANT
PAD PREP 24X48 CUFFED NSTRL (MISCELLANEOUS) ×3 IMPLANT
SET BERKELEY SUCTION TUBING (SUCTIONS) ×3 IMPLANT
TOWEL OR 17X24 6PK STRL BLUE (TOWEL DISPOSABLE) ×3 IMPLANT
VACURETTE 10 RIGID CVD (CANNULA) IMPLANT
VACURETTE 6 ASPIR F TIP BERK (CANNULA) IMPLANT
VACURETTE 7MM CVD STRL WRAP (CANNULA) ×3 IMPLANT
VACURETTE 8 RIGID CVD (CANNULA) ×3 IMPLANT
VACURETTE 9 RIGID CVD (CANNULA) IMPLANT

## 2013-09-01 NOTE — Discharge Instructions (Signed)
DISCHARGE INSTRUCTIONS: D&E The following instructions have been prepared to help you care for yourself upon your return home.  MAY TAKE IBUPROFEN (MOTRIN, ADVIL) OR ALEVE AFTER 9:30 PM FOR CRAMPS!!!   Personal hygiene:  Use sanitary pads for vaginal drainage, not tampons.  Shower the day after your procedure.  NO tub baths, pools or Jacuzzis for 2-3 weeks.  Wipe front to back after using the bathroom.  Activity and limitations:  Do NOT drive or operate any equipment for 24 hours. The effects of anesthesia are still present and drowsiness may result.  Do NOT rest in bed all day.  Walking is encouraged.  Walk up and down stairs slowly.  You may resume your normal activity in one to two days or as indicated by your physician.  Sexual activity: NO intercourse for at least 2 weeks after the procedure, or as indicated by your physician.  Diet: Eat a light meal as desired this evening. You may resume your usual diet tomorrow.  Return to work: You may resume your work activities in one to two days or as indicated by your doctor.  What to expect after your surgery: Expect to have vaginal bleeding/discharge for 2-3 days and spotting for up to 10 days. It is not unusual to have soreness for up to 1-2 weeks. You may have a slight burning sensation when you urinate for the first day. Mild cramps may continue for a couple of days. You may have a regular period in 2-6 weeks.  Call your doctor for any of the following:  Excessive vaginal bleeding, saturating and changing one pad every hour.  Inability to urinate 6 hours after discharge from hospital.  Pain not relieved by pain medication.  Fever of 100.4 F or greater.  Unusual vaginal discharge or odor.   Call for an appointment:    Patients signature: ______________________  Nurses signature ________________________  Support person's signature_______________________

## 2013-09-01 NOTE — H&P (Signed)
Kristina [name] is an 28 y.o. female. LMP 07/21/13.  EGA 8w.  Here for D&E for missed Ab at 8w. dx last week on viability US. Pt has not had any bleeding or cramping.  Her PUS was c/w dates but not FH was detected.  HCG 21176, 08/26/13 20294 7/29.  Pt elected for D&E.  Pt is AB+  Pertinent Gynecological History: Menses: flow is moderate Bleeding: n9ormal Contraception: none DES exposure: denies Blood transfusions: none Sexually transmitted diseases: no past history Previous GYN Procedures: none  OB History: G1, P0   Menstrual History:  Patient's last menstrual period was 07/01/2013.    Past Medical History  Diagnosis Date  . Irregular bleeding 05/06/2013  . Menometrorrhagia   . Asthma     mild with cold symptoms  . GERD (gastroesophageal reflux disease)     no symptoms over 1 yr    Past Surgical History  Procedure Laterality Date  . Wisdom tooth extraction  2003  . Tonsillectomy and adenoidectomy      Just adenoids removed  . Upper gi endoscopy      Family History  Problem Relation Age of Onset  . Melanoma Maternal Grandfather   . Skin cancer Paternal Grandfather   . Skin cancer Paternal Grandmother   . Breast cancer Maternal Aunt 54  . Diabetes Mellitus II Mother   . Fibroids Mother   . Hypertension Mother   . Diabetes Mellitus II Maternal Grandmother   . Heart attack Maternal Grandmother   . Osteoarthritis Maternal Grandmother   . Diabetes Mellitus I Paternal Aunt     Social History:  reports that she has never smoked. She has never used smokeless tobacco. She reports that she drinks about .5 ounces of alcohol per week. She reports that she does not use illicit drugs.  Allergies: No Known Allergies  No prescriptions prior to admission    ROS Per HPI  Blood pressure 97/60, temperature 98.2 F (36.8 C), temperature source Oral, resp. rate 18, last menstrual period 07/01/2013, SpO2 100.00%. Physical Exam  Nursing note and vitals reviewed. Constitutional: She  is oriented to person, place, and time. She appears well-developed and well-nourished.  Cardiovascular: Normal rate and regular rhythm.   Respiratory: Effort normal and breath sounds normal.  GI: Soft. Bowel sounds are normal. She exhibits no distension. There is no tenderness. There is no rebound and no guarding.  Neurological: She is alert and oriented to person, place, and time.  Skin: Skin is warm and dry.    Results for orders placed during the hospital encounter of 09/01/13 (from the past 24 hour(s))  CBC     Status: Abnormal   Collection Time    09/01/13  1:25 PM      Result Value Ref Range   WBC 7.5  4.0 - 10.5 K/uL   RBC 4.12  3.87 - 5.11 MIL/uL   Hemoglobin 12.3  12.0 - 15.0 g/dL   HCT 16.134.8 (*) 09.636.0 - 04.546.0 %   MCV 84.5  78.0 - 100.0 fL   MCH 29.9  26.0 - 34.0 pg   MCHC 35.3  30.0 - 36.0 g/dL   RDW 40.912.9  81.111.5 - 91.415.5 %   Platelets 160  150 - 400 K/uL    No results found.  Assessment/Plan: MAB at 8w for D&E  Jager Koska H 09/01/2013, 2:52 PM

## 2013-09-01 NOTE — Transfer of Care (Signed)
Immediate Anesthesia Transfer of Care Note  Patient: Kristina [name]  Procedure(s) Performed: Procedure(s): DILATATION AND EVACUATION (N/A)  Patient Location: PACU  Anesthesia Type:MAC  Level of Consciousness: sedated  Airway & Oxygen Therapy: Patient Spontanous Breathing and Patient connected to nasal cannula oxygen  Post-op Assessment: Report given to PACU RN  Post vital signs: Reviewed and stable  Complications: No apparent anesthesia complications

## 2013-09-01 NOTE — H&P (Signed)
STARTED IN ERROR

## 2013-09-01 NOTE — Anesthesia Postprocedure Evaluation (Signed)
  Anesthesia Post Note  Patient: Kristina LudwigMiranda [name]  Procedure(s) Performed: Procedure(s) (LRB): DILATATION AND EVACUATION (N/A)  Anesthesia type: MAC  Patient location: PACU  Post pain: Pain level controlled  Post assessment: Post-op Vital signs reviewed  Last Vitals:  Filed Vitals:   09/01/13 1600  BP: 104/47  Pulse: 73  Temp:   Resp: 19    Post vital signs: Reviewed  Level of consciousness: sedated  Complications: No apparent anesthesia complications

## 2013-09-01 NOTE — Brief Op Note (Signed)
09/01/2013  3:37 PM  PATIENT:  Kristina LudwigMiranda Ackers  28 y.o. female  PRE-OPERATIVE DIAGNOSIS:  MISS AB  POST-OPERATIVE DIAGNOSIS:  MISS AB  PROCEDURE:  Procedure(s): DILATATION AND EVACUATION (N/A)  SURGEON:  Surgeon(s) and Role:    * Bennye Almracy H Meribeth Vitug, MD - Primary  PHYSICIAN ASSISTANT:   ASSISTANTS: none   ANESTHESIA:   MAC  EBL:  Total I/O In: 900 [I.V.:900] Out: 40 [Urine:40]  BLOOD ADMINISTERED:none  DRAINS: none   LOCAL MEDICATIONS USED:  0.25%MARCAINE   , 2%LIDOCAINE  and Amount: 20 ml  SPECIMEN:  Source of Specimen:  uterus  DISPOSITION OF SPECIMEN:  PATHOLOGY  COUNTS:  YES  TOURNIQUET:  * No tourniquets in log *  DICTATION: .Other Dictation: Dictation Number [id number]  PLAN OF CARE: Discharge to home after PACU  PATIENT DISPOSITION:  PACU - hemodynamically stable.   Delay start of Pharmacological VTE agent (>24hrs) due to surgical blood loss or risk of bleeding: not applicable

## 2013-09-01 NOTE — Anesthesia Preprocedure Evaluation (Signed)
Anesthesia Evaluation  Patient identified by MRN, date of birth, ID band Patient awake    Reviewed: Allergy & Precautions, H&P , Patient's Chart, lab work & pertinent test results, reviewed documented beta blocker date and time   History of Anesthesia Complications Negative for: history of anesthetic complications  Airway Mallampati: II TM Distance: >3 FB Neck ROM: full    Dental   Pulmonary asthma ,  breath sounds clear to auscultation        Cardiovascular Exercise Tolerance: Good Rhythm:regular Rate:Normal     Neuro/Psych negative psych ROS   GI/Hepatic GERD-  Controlled,  Endo/Other    Renal/GU      Musculoskeletal   Abdominal   Peds  Hematology   Anesthesia Other Findings   Reproductive/Obstetrics                           Anesthesia Physical Anesthesia Plan  ASA: II  Anesthesia Plan: MAC   Post-op Pain Management:    Induction:   Airway Management Planned:   Additional Equipment:   Intra-op Plan:   Post-operative Plan:   Informed Consent: I have reviewed the patients History and Physical, chart, labs and discussed the procedure including the risks, benefits and alternatives for the proposed anesthesia with the patient or authorized representative who has indicated his/her understanding and acceptance.   Dental Advisory Given  Plan Discussed with: CRNA, Surgeon and Anesthesiologist  Anesthesia Plan Comments:         Anesthesia Quick Evaluation

## 2013-09-02 ENCOUNTER — Encounter (HOSPITAL_COMMUNITY): Payer: Self-pay | Admitting: Gynecology

## 2013-09-02 NOTE — Op Note (Signed)
NAMKathi [name]:  Kristina [name], Kristina [name]              ACCOUNT NO.:  [id number]  MEDICAL RECORD NO.:  [id number]  LOCATION:  [facility]                          FACILITY:  [facility]  PHYSICIAN:  Ivor Costaracy H. Farrel GobbleLathrop, M.D. DATE OF BIRTH:  [birth year]  DATE OF PROCEDURE:  09/01/2013 DATE OF DISCHARGE:  09/01/2013                              OPERATIVE REPORT   PREOPERATIVE DIAGNOSIS:  Missed abortion at 8 weeks.  POSTOPERATIVE DIAGNOSIS:  Missed abortion at 8 weeks.  PROCEDURE:  Dilation and evacuation.  SURGEON:  Ivor Costaracy H. Farrel GobbleLathrop, M.D.  ANESTHESIA:  MAC and paracervical block of equal parts, 2% lidocaine and 0.25% Marcaine for a total of 20 mL.  FINDINGS:  Anteverted uterus sounding to 9.  PATHOLOGY:  Uterine contents.  COMPLICATIONS:  None.  INDICATIONS:  The patient is a 28 year old G1, P0 at 8 weeks based on LMP, who had a nonviable fetus by ultrasound, who elects now for a D and E.  PROCEDURE IN DETAIL:  The patient was taken to the operating room, IV sedation was induced, placed in the dorsal lithotomy position, prepped and draped in usual sterile fashion.  A bimanual exam was performed. The orientation of the uterus was confirmed.  The bivalve speculum was placed in the vagina.  The cervix was visualized, stabilized with a single-tooth tenaculum.  [place].  During the dilation process, the sound was advanced once the cervix was minimally dilated and the uterus was sounded to 8. The cervix was then gently dilated up to 11F-  Despite being 24-French, the #8 suction curette was unable to be advanced with ease.  Attempted dilation beyond this point was unsuccessful.  The straighter 7 suction curette was advanced through the cervix and into the uterus.  The suction was brought up to 60 and with a gentle spinning fashion the contents were cleared on two passes.  A third pass was performed for no tissue.  A gentle curettage performed showed a good cry.  The suction curette was then readvanced for no further  tissue.    She was given Methergine at the end of the procedure and bimanual uterine massage.  Replacement of the speculum showed no bleeding from the os.  The patient tolerated the procedure well. Sponge, lap, needle counts correct x2. She was given iv Doxycycline pre-op.     Ivor Costaracy H. Farrel GobbleLathrop, M.D.     THL/MEDQ  D:  09/01/2013  T:  09/01/2013  Job:  [id number]

## 2013-09-04 ENCOUNTER — Encounter (HOSPITAL_COMMUNITY): Payer: Self-pay | Admitting: Gynecology

## 2013-09-04 NOTE — Telephone Encounter (Signed)
Message copied by Jannet AskewHINES, Hayleen Clinkscales E on Thu Sep 04, 2013 11:26 AM ------      Message from: Douglass RiversLATHROP, TRACY      Created: Wed Sep 03, 2013  5:31 PM       INFORM PATHOLOGY WAS C/W PREGNANCY ------

## 2013-09-04 NOTE — Telephone Encounter (Signed)
Spoke with patient. Advised of results as seen below from Dr.Lathrop. Patient is agreeable. Advised patient if she needs anything or has any further questions to feel free to call back.   Routing to provider for final review. Patient agreeable to disposition. Will close encounter

## 2013-09-12 ENCOUNTER — Ambulatory Visit (INDEPENDENT_AMBULATORY_CARE_PROVIDER_SITE_OTHER): Payer: BC Managed Care – PPO | Admitting: Gynecology

## 2013-09-12 ENCOUNTER — Encounter: Payer: Self-pay | Admitting: Gynecology

## 2013-09-12 VITALS — BP 102/74 | HR 70 | Resp 14 | Ht 64.0 in | Wt 124.0 lb

## 2013-09-12 DIAGNOSIS — Z87898 Personal history of other specified conditions: Secondary | ICD-10-CM

## 2013-09-12 DIAGNOSIS — O021 Missed abortion: Secondary | ICD-10-CM

## 2013-09-12 DIAGNOSIS — Z789 Other specified health status: Secondary | ICD-10-CM

## 2013-09-12 NOTE — Progress Notes (Signed)
Subjective:     Patient ID: Kathi LudwigMiranda Ertle, female   DOB: [birth year], 28 y.o.   MRN: [id number]  HPI Comments: Pt here 2w after D&E for missed abortion.  Pt is no longer bleeding, bled very little after procedure, passed 1 "clot".  Is feeling "ok" but she is not planning to concieve any time soon.  She has the clomid.    Review of Systems  Constitutional: Positive for fever. Negative for chills and fatigue.  Genitourinary: Negative for vaginal discharge and vaginal pain.       Objective:   Physical Exam  Nursing note and vitals reviewed. Constitutional: She is oriented to person, place, and time. She appears well-developed and well-nourished.  Genitourinary: Vagina normal and uterus normal. There is no rash or tenderness on the right labia. There is no rash or tenderness on the left labia. Uterus is not tender. Cervix exhibits no motion tenderness, no discharge and no friability. Right adnexum displays no mass and no tenderness. Left adnexum displays no mass and no tenderness.  Lymphadenopathy:       Left: No inguinal adenopathy present.  Neurological: She is alert and oriented to person, place, and time.       Assessment:     Doing well after D&E    Plan:     F/u prn Can try clomid when ready but recommend she let us know, she is agreeable HCG today

## 2013-09-13 LAB — HCG, QUANTITATIVE, PREGNANCY: hCG, Beta Chain, Quant, S: 66.1 m[IU]/mL

## 2013-11-28 ENCOUNTER — Encounter: Payer: Self-pay | Admitting: Nurse Practitioner

## 2013-11-28 ENCOUNTER — Ambulatory Visit (INDEPENDENT_AMBULATORY_CARE_PROVIDER_SITE_OTHER): Payer: BC Managed Care – PPO | Admitting: Nurse Practitioner

## 2013-11-28 VITALS — BP 110/66 | HR 92 | Ht 64.0 in | Wt 123.0 lb

## 2013-11-28 DIAGNOSIS — N912 Amenorrhea, unspecified: Secondary | ICD-10-CM

## 2013-11-28 LAB — POCT URINE PREGNANCY: Preg Test, Ur: POSITIVE

## 2013-11-28 NOTE — Progress Notes (Signed)
  28 y.o. Married Caucasian [id number] female presents for a consul visit to discuss amenorrhea.  She is without a menses sinceSeptember 27.  Currently periods are very irregular since off Depo Provera  10/2011.  Now cycle occur every 14-32 days.  With a regular cycle vaginal bleeding is 4-7 days. Periods were regular in the past prior to Depo Provera.   Patient reports fatigued, loss of appetite and slight  Nausea.  She is on Prenatal MVI.  She had a miscarriage in August while she was at 8 weeks.  Reports no method of birth control since coming off Depo Provera.  Urine pregnancy positive on Monday.  Patient denies any vaginal pain, bleeding or discharge.     Pertinent items are noted in HPI. No exam performed today UPT is positive  Assessment:  Amenorrhea secondary to pregnancy  Plan:  Discussed with patient factors that may be contributory to early miscarriage and bleeding.    She will avoid any activities that puts her at risk.    She will continue with prenatal MVI.    A PUS is scheduled for about 7 weeks.    If any VB at all to call ASAP.   Consult visit: 15 minutes

## 2013-11-30 NOTE — Progress Notes (Signed)
Encounter reviewed by Dr. Terry Abila Silva.  

## 2013-12-01 ENCOUNTER — Telehealth: Payer: Self-pay | Admitting: Obstetrics & Gynecology

## 2013-12-01 ENCOUNTER — Encounter: Payer: Self-pay | Admitting: Nurse Practitioner

## 2013-12-01 NOTE — Telephone Encounter (Signed)
Left message for patient to call back. Need to go over benefits and schedule PUS °

## 2013-12-01 NOTE — Telephone Encounter (Signed)
Patient returned call. Advised that per benefit quote received, she will be responsible to pay a $35 copay when she comes in for PUS. Patient agreeable. Scheduled PUS. Advised patient of 72 hour cancellation policy and $100 cancellation fee. Patient agreeable.

## 2013-12-04 NOTE — Addendum Note (Signed)
Addended by: Joeseph AmorFAST, Ezana Hubbert L on: 12/04/2013 01:33 PM   Modules accepted: Orders

## 2013-12-18 ENCOUNTER — Ambulatory Visit (INDEPENDENT_AMBULATORY_CARE_PROVIDER_SITE_OTHER): Payer: BC Managed Care – PPO | Admitting: Obstetrics and Gynecology

## 2013-12-18 ENCOUNTER — Other Ambulatory Visit: Payer: Self-pay | Admitting: Obstetrics and Gynecology

## 2013-12-18 ENCOUNTER — Encounter: Payer: Self-pay | Admitting: Obstetrics and Gynecology

## 2013-12-18 ENCOUNTER — Ambulatory Visit (INDEPENDENT_AMBULATORY_CARE_PROVIDER_SITE_OTHER): Payer: BC Managed Care – PPO

## 2013-12-18 ENCOUNTER — Telehealth: Payer: Self-pay | Admitting: Obstetrics and Gynecology

## 2013-12-18 VITALS — BP 102/66 | Resp 16 | Ht 64.0 in | Wt 120.0 lb

## 2013-12-18 DIAGNOSIS — Z3490 Encounter for supervision of normal pregnancy, unspecified, unspecified trimester: Secondary | ICD-10-CM

## 2013-12-18 DIAGNOSIS — Z331 Pregnant state, incidental: Secondary | ICD-10-CM

## 2013-12-18 DIAGNOSIS — N912 Amenorrhea, unspecified: Secondary | ICD-10-CM

## 2013-12-18 LAB — US OB TRANSVAGINAL

## 2013-12-18 NOTE — Telephone Encounter (Signed)
Patient is [facility] for [facility]. She is requesting we send records to them. She was seen today for an ultrasound.

## 2013-12-18 NOTE — Patient Instructions (Signed)
Please select an OB office for your care.  Call them to make your first appointment. Congratulations!

## 2013-12-18 NOTE — Progress Notes (Signed)
Subjective  28 year old G2P0010  Patient is here today for viability ultrasound for pregnancy.   Status post dilation and evacuation for missed abortion on 09/02/13 - [redacted] weeks gestation.  Final pathology showed chorionic villi.   LMP 10/26/13. Spontaneous conception.  Questions if she had some spotting since LMP.  Fatigue and nausea.   Taking MVI.   Will start PNV   Some external vulvar itching.   BT:  AB positive   Objective   See ultrasound - images and report reviewed with patient.   Viable IUP - 7 + 4 weeks.  FH 161 bpm.  SCH 30 x 9 x 8 mm.  Right CL cyst 14 mm.       Assessment   Viable IUP.  Subchorionic hemorrhage noted.  Rh positive.   Plan  [facility].  She will call provider for bleeding.  She will establish care with OB office.  List of providers given.  Start PNV.  General reading suggested about pregnancy care.   15 minutes face to face time of which over 50% was spent in counseling.   After visit summary to patient.

## 2014-01-05 ENCOUNTER — Other Ambulatory Visit: Payer: Self-pay

## 2014-01-05 LAB — OB RESULTS CONSOLE GBS: STREP GROUP B AG: POSITIVE

## 2014-01-05 LAB — OB RESULTS CONSOLE HIV ANTIBODY (ROUTINE TESTING): HIV: NONREACTIVE

## 2014-01-05 LAB — OB RESULTS CONSOLE RUBELLA ANTIBODY, IGM: RUBELLA: IMMUNE

## 2014-01-05 LAB — OB RESULTS CONSOLE HEPATITIS B SURFACE ANTIGEN: Hepatitis B Surface Ag: NEGATIVE

## 2014-01-05 LAB — OB RESULTS CONSOLE ABO/RH: RH TYPE: POSITIVE

## 2014-01-05 LAB — OB RESULTS CONSOLE ANTIBODY SCREEN: ANTIBODY SCREEN: NEGATIVE

## 2014-01-05 LAB — OB RESULTS CONSOLE GC/CHLAMYDIA
Chlamydia: NEGATIVE
Gonorrhea: NEGATIVE

## 2014-01-05 LAB — OB RESULTS CONSOLE RPR: RPR: NONREACTIVE

## 2014-01-30 NOTE — L&D Delivery Note (Signed)
Delivery Note At 2:53 PM a viable and healthy female was delivered via Vaginal, Spontaneous Delivery (Presentation: Right Occiput Anterior).  APGAR: 9, 9; weight pending .   Placenta status: Intact, Spontaneous.  Cord: 3 vessels with the following complications: None.   Anesthesia: Epidural & 1% lidocaine Episiotomy: None Lacerations:  2nd degree Suture Repair: 3.0 vicryl Est. Blood Loss (mL): 315  Mom to postpartum.  Baby to Couplet care / Skin to Skin.  Garreth Burnsworth H. 08/08/2014, 3:19 PM

## 2014-05-08 ENCOUNTER — Ambulatory Visit: Payer: Self-pay | Admitting: Gynecology

## 2014-08-06 ENCOUNTER — Encounter (HOSPITAL_COMMUNITY): Payer: Self-pay | Admitting: *Deleted

## 2014-08-06 ENCOUNTER — Telehealth (HOSPITAL_COMMUNITY): Payer: Self-pay | Admitting: *Deleted

## 2014-08-06 NOTE — Telephone Encounter (Signed)
Preadmission screen  

## 2014-08-07 ENCOUNTER — Inpatient Hospital Stay (HOSPITAL_COMMUNITY)
Admission: AD | Admit: 2014-08-07 | Discharge: 2014-08-07 | Disposition: A | Discharge status: Home / Self Care | Payer: BC Managed Care – PPO | Source: Ambulatory Visit | Attending: Obstetrics and Gynecology | Admitting: Obstetrics and Gynecology

## 2014-08-07 ENCOUNTER — Encounter (HOSPITAL_COMMUNITY): Payer: Self-pay | Admitting: *Deleted

## 2014-08-07 DIAGNOSIS — D649 Anemia, unspecified: Secondary | ICD-10-CM | POA: Diagnosis present

## 2014-08-07 DIAGNOSIS — O9962 Diseases of the digestive system complicating childbirth: Secondary | ICD-10-CM | POA: Diagnosis present

## 2014-08-07 DIAGNOSIS — O99824 Streptococcus B carrier state complicating childbirth: Secondary | ICD-10-CM | POA: Diagnosis present

## 2014-08-07 DIAGNOSIS — Z3A40 40 weeks gestation of pregnancy: Secondary | ICD-10-CM | POA: Diagnosis present

## 2014-08-07 DIAGNOSIS — Z833 Family history of diabetes mellitus: Secondary | ICD-10-CM

## 2014-08-07 DIAGNOSIS — O9081 Anemia of the puerperium: Secondary | ICD-10-CM | POA: Diagnosis present

## 2014-08-07 DIAGNOSIS — K219 Gastro-esophageal reflux disease without esophagitis: Secondary | ICD-10-CM | POA: Diagnosis present

## 2014-08-07 NOTE — MAU Note (Signed)
Urine in lab 

## 2014-08-07 NOTE — Discharge Instructions (Signed)
Return to hospital Sunday night @ midnight for induction.

## 2014-08-07 NOTE — MAU Note (Signed)
Pt here for contractions since last pm. Getting progressively worse. Denies bleeding or gush of fluid.

## 2014-08-07 NOTE — Progress Notes (Signed)
Dr. Tenny Crawoss has instructed [facility] Wed.  States she will let L&D know patient will be coming in.

## 2014-08-08 ENCOUNTER — Inpatient Hospital Stay (HOSPITAL_COMMUNITY): Payer: BC Managed Care – PPO | Admitting: Anesthesiology

## 2014-08-08 ENCOUNTER — Encounter (HOSPITAL_COMMUNITY): Payer: Self-pay | Admitting: *Deleted

## 2014-08-08 ENCOUNTER — Inpatient Hospital Stay (HOSPITAL_COMMUNITY)
Admission: AD | Admit: 2014-08-08 | Discharge: 2014-08-10 | DRG: 775 | Disposition: A | Discharge status: Home / Self Care | Payer: BC Managed Care – PPO | Source: Ambulatory Visit | Attending: Obstetrics and Gynecology | Admitting: Obstetrics and Gynecology

## 2014-08-08 DIAGNOSIS — Z3A40 40 weeks gestation of pregnancy: Secondary | ICD-10-CM | POA: Diagnosis present

## 2014-08-08 DIAGNOSIS — O99824 Streptococcus B carrier state complicating childbirth: Secondary | ICD-10-CM | POA: Diagnosis present

## 2014-08-08 DIAGNOSIS — O9081 Anemia of the puerperium: Secondary | ICD-10-CM | POA: Diagnosis present

## 2014-08-08 DIAGNOSIS — K219 Gastro-esophageal reflux disease without esophagitis: Secondary | ICD-10-CM | POA: Diagnosis present

## 2014-08-08 DIAGNOSIS — Z833 Family history of diabetes mellitus: Secondary | ICD-10-CM | POA: Diagnosis not present

## 2014-08-08 DIAGNOSIS — D649 Anemia, unspecified: Secondary | ICD-10-CM | POA: Diagnosis present

## 2014-08-08 DIAGNOSIS — O9962 Diseases of the digestive system complicating childbirth: Secondary | ICD-10-CM | POA: Diagnosis present

## 2014-08-08 LAB — CBC
HCT: 33 % — ABNORMAL LOW (ref 36.0–46.0)
Hemoglobin: 10.8 g/dL — ABNORMAL LOW (ref 12.0–15.0)
MCH: 25.2 pg — ABNORMAL LOW (ref 26.0–34.0)
MCHC: 32.7 g/dL (ref 30.0–36.0)
MCV: 76.9 fL — ABNORMAL LOW (ref 78.0–100.0)
Platelets: 168 10*3/uL (ref 150–400)
RBC: 4.29 MIL/uL (ref 3.87–5.11)
RDW: 14.5 % (ref 11.5–15.5)
WBC: 14.7 10*3/uL — ABNORMAL HIGH (ref 4.0–10.5)

## 2014-08-08 LAB — TYPE AND SCREEN
ABO/RH(D): AB POS
Antibody Screen: NEGATIVE

## 2014-08-08 LAB — ABO/RH: ABO/RH(D): AB POS

## 2014-08-08 MED ORDER — ONDANSETRON HCL 4 MG/2ML IJ SOLN: 4.0000 mg | INTRAMUSCULAR | Status: DC | PRN

## 2014-08-08 MED ORDER — ACETAMINOPHEN 325 MG PO TABS: 650.0000 mg | ORAL_TABLET | ORAL | Status: DC | PRN

## 2014-08-08 MED ORDER — OXYCODONE-ACETAMINOPHEN 5-325 MG PO TABS
1.0000 | ORAL_TABLET | ORAL | Status: DC | PRN
Start: 2014-08-08 — End: 2014-08-08

## 2014-08-08 MED ORDER — EPHEDRINE 5 MG/ML INJ
10.0000 mg | INTRAVENOUS | Status: DC | PRN
  Filled 2014-08-08: qty 2

## 2014-08-08 MED ORDER — OXYCODONE-ACETAMINOPHEN 5-325 MG PO TABS: 2.0000 | ORAL_TABLET | ORAL | Status: DC | PRN

## 2014-08-08 MED ORDER — TETANUS-DIPHTH-ACELL PERTUSSIS 5-2.5-18.5 LF-MCG/0.5 IM SUSP: 0.5000 mL | Freq: Once | INTRAMUSCULAR | Status: DC

## 2014-08-08 MED ORDER — SIMETHICONE 80 MG PO CHEW: 80.0000 mg | CHEWABLE_TABLET | ORAL | Status: DC | PRN

## 2014-08-08 MED ORDER — TERBUTALINE SULFATE 1 MG/ML IJ SOLN
0.2500 mg | Freq: Once | INTRAMUSCULAR | Status: DC | PRN
  Filled 2014-08-08: qty 1

## 2014-08-08 MED ORDER — PRENATAL MULTIVITAMIN CH
1.0000 | ORAL_TABLET | Freq: Every day | ORAL | Status: DC
  Administered 2014-08-09 – 2014-08-10 (×2): 1 via ORAL
  Filled 2014-08-08 (×2): qty 1

## 2014-08-08 MED ORDER — IBUPROFEN 600 MG PO TABS
600.0000 mg | ORAL_TABLET | Freq: Four times a day (QID) | ORAL | Status: DC
  Administered 2014-08-08 – 2014-08-10 (×8): 600 mg via ORAL
  Filled 2014-08-08 (×8): qty 1

## 2014-08-08 MED ORDER — DIPHENHYDRAMINE HCL 25 MG PO CAPS: 25.0000 mg | ORAL_CAPSULE | Freq: Four times a day (QID) | ORAL | Status: DC | PRN

## 2014-08-08 MED ORDER — LIDOCAINE HCL (PF) 1 % IJ SOLN
30.0000 mL | INTRAMUSCULAR | Status: AC | PRN
  Administered 2014-08-08: 30 mL via SUBCUTANEOUS
  Filled 2014-08-08: qty 30

## 2014-08-08 MED ORDER — ZOLPIDEM TARTRATE 5 MG PO TABS: 5.0000 mg | ORAL_TABLET | Freq: Every evening | ORAL | Status: DC | PRN

## 2014-08-08 MED ORDER — DIPHENHYDRAMINE HCL 50 MG/ML IJ SOLN: 12.5000 mg | INTRAMUSCULAR | Status: DC | PRN

## 2014-08-08 MED ORDER — DIBUCAINE 1 % RE OINT: 1.0000 "application " | TOPICAL_OINTMENT | RECTAL | Status: DC | PRN

## 2014-08-08 MED ORDER — BENZOCAINE-MENTHOL 20-0.5 % EX AERO
1.0000 "application " | INHALATION_SPRAY | CUTANEOUS | Status: DC | PRN
  Filled 2014-08-08: qty 56

## 2014-08-08 MED ORDER — LANOLIN HYDROUS EX OINT: TOPICAL_OINTMENT | CUTANEOUS | Status: DC | PRN

## 2014-08-08 MED ORDER — PHENYLEPHRINE 40 MCG/ML (10ML) SYRINGE FOR IV PUSH (FOR BLOOD PRESSURE SUPPORT)
80.0000 ug | PREFILLED_SYRINGE | INTRAVENOUS | Status: DC | PRN
  Administered 2014-08-08 (×2): 80 ug via INTRAVENOUS
  Filled 2014-08-08: qty 2
  Filled 2014-08-08: qty 20

## 2014-08-08 MED ORDER — FENTANYL 2.5 MCG/ML BUPIVACAINE 1/10 % EPIDURAL INFUSION (WH - ANES)
14.0000 mL/h | INTRAMUSCULAR | Status: DC | PRN
  Administered 2014-08-08 (×2): 14 mL/h via EPIDURAL
  Filled 2014-08-08: qty 125

## 2014-08-08 MED ORDER — FENTANYL CITRATE (PF) 100 MCG/2ML IJ SOLN
INTRAMUSCULAR | Status: AC
Start: 2014-08-08 — End: 2014-08-08
  Filled 2014-08-08: qty 2

## 2014-08-08 MED ORDER — SENNOSIDES-DOCUSATE SODIUM 8.6-50 MG PO TABS
2.0000 | ORAL_TABLET | ORAL | Status: DC
  Administered 2014-08-08 – 2014-08-10 (×2): 2 via ORAL
  Filled 2014-08-08 (×2): qty 2

## 2014-08-08 MED ORDER — WITCH HAZEL-GLYCERIN EX PADS: 1.0000 "application " | MEDICATED_PAD | CUTANEOUS | Status: DC | PRN

## 2014-08-08 MED ORDER — BUTORPHANOL TARTRATE 1 MG/ML IJ SOLN: 1.0000 mg | INTRAMUSCULAR | Status: DC | PRN

## 2014-08-08 MED ORDER — FENTANYL CITRATE (PF) 100 MCG/2ML IJ SOLN
INTRAMUSCULAR | Status: DC | PRN
  Administered 2014-08-08: 30 ug via EPIDURAL

## 2014-08-08 MED ORDER — LACTATED RINGERS IV SOLN: 500.0000 mL | INTRAVENOUS | Status: DC | PRN

## 2014-08-08 MED ORDER — OXYTOCIN 40 UNITS IN LACTATED RINGERS INFUSION - SIMPLE MED
62.5000 mL/h | INTRAVENOUS | Status: DC
  Administered 2014-08-08: 62.5 mL/h via INTRAVENOUS
  Filled 2014-08-08: qty 1000

## 2014-08-08 MED ORDER — ONDANSETRON HCL 4 MG/2ML IJ SOLN: 4.0000 mg | Freq: Four times a day (QID) | INTRAMUSCULAR | Status: DC | PRN

## 2014-08-08 MED ORDER — OXYTOCIN BOLUS FROM INFUSION
500.0000 mL | INTRAVENOUS | Status: DC
Start: 2014-08-08 — End: 2014-08-08

## 2014-08-08 MED ORDER — OXYTOCIN 40 UNITS IN LACTATED RINGERS INFUSION - SIMPLE MED: 1.0000 m[IU]/min | INTRAVENOUS | Status: DC

## 2014-08-08 MED ORDER — CITRIC ACID-SODIUM CITRATE 334-500 MG/5ML PO SOLN: 30.0000 mL | ORAL | Status: DC | PRN

## 2014-08-08 MED ORDER — SODIUM CHLORIDE 0.9 % IV SOLN
2.0000 g | Freq: Once | INTRAVENOUS | Status: AC
  Administered 2014-08-08: 2 g via INTRAVENOUS
  Filled 2014-08-08: qty 2000

## 2014-08-08 MED ORDER — ONDANSETRON HCL 4 MG PO TABS: 4.0000 mg | ORAL_TABLET | ORAL | Status: DC | PRN

## 2014-08-08 MED ORDER — OXYCODONE-ACETAMINOPHEN 5-325 MG PO TABS
2.0000 | ORAL_TABLET | ORAL | Status: DC | PRN
Start: 2014-08-08 — End: 2014-08-08

## 2014-08-08 MED ORDER — METHYLERGONOVINE MALEATE 0.2 MG PO TABS: 0.2000 mg | ORAL_TABLET | ORAL | Status: DC | PRN

## 2014-08-08 MED ORDER — LIDOCAINE HCL (PF) 1 % IJ SOLN
INTRAMUSCULAR | Status: DC | PRN
  Administered 2014-08-08: 9 mL
  Administered 2014-08-08: 7 mL

## 2014-08-08 MED ORDER — BUPIVACAINE HCL (PF) 0.25 % IJ SOLN
INTRAMUSCULAR | Status: DC | PRN
  Administered 2014-08-08: 3 mL

## 2014-08-08 MED ORDER — ALBUTEROL SULFATE (2.5 MG/3ML) 0.083% IN NEBU: 3.0000 mL | INHALATION_SOLUTION | RESPIRATORY_TRACT | Status: DC | PRN

## 2014-08-08 MED ORDER — OXYCODONE-ACETAMINOPHEN 5-325 MG PO TABS: 1.0000 | ORAL_TABLET | ORAL | Status: DC | PRN

## 2014-08-08 MED ORDER — METHYLERGONOVINE MALEATE 0.2 MG/ML IJ SOLN: 0.2000 mg | INTRAMUSCULAR | Status: DC | PRN

## 2014-08-08 MED ORDER — LACTATED RINGERS IV SOLN
INTRAVENOUS | Status: DC
  Administered 2014-08-08 (×2): via INTRAVENOUS

## 2014-08-08 MED ORDER — ACETAMINOPHEN 325 MG PO TABS
650.0000 mg | ORAL_TABLET | ORAL | Status: DC | PRN
  Administered 2014-08-08 – 2014-08-10 (×3): 650 mg via ORAL
  Filled 2014-08-08 (×3): qty 2

## 2014-08-08 NOTE — Lactation Note (Signed)
This note was copied from the chart of Boy Kathi LudwigMiranda Allende. Lactation Consultation Note MBU RN reports mom is sleeping and requests not to be awakened at this time.  Patient Name: [name] OZHYQ'MToday's Date: 08/08/2014     Maternal Data    Feeding Feeding Type: Breast Fed Length of feed: 17 min  LATCH Score/Interventions Latch: Grasps breast easily, tongue down, lips flanged, rhythmical sucking.  Audible Swallowing: A few with stimulation Intervention(s): Skin to skin;Hand expression Intervention(s): Skin to skin  Type of Nipple: Everted at rest and after stimulation  Comfort (Breast/Nipple): Soft / non-tender     Hold (Positioning): Assistance needed to correctly position infant at breast and maintain latch. Intervention(s): Breastfeeding basics reviewed;Support Pillows;Position options;Skin to skin  LATCH Score: 8  Lactation Tools Discussed/Used     Consult Status      Shoptaw, Arvella MerlesJana Lynn 08/08/2014, 10:38 PM

## 2014-08-08 NOTE — Anesthesia Procedure Notes (Signed)
Epidural Patient location during procedure: OB Start time: 08/08/2014 12:06 PM End time: 08/08/2014 12:10 PM  Staffing Anesthesiologist: Leilani AbleHATCHETT, Jermon Chalfant Performed by: anesthesiologist   Preanesthetic Checklist Completed: patient identified, surgical consent, pre-op evaluation, timeout performed, IV checked, risks and benefits discussed and monitors and equipment checked  Epidural Patient position: sitting Prep: site prepped and draped and DuraPrep Patient monitoring: continuous pulse ox and blood pressure Approach: midline Location: L3-L4 Injection technique: LOR air  Needle:  Needle type: Tuohy  Needle gauge: 17 G Needle length: 9 cm and 9 Needle insertion depth: 5 cm cm Catheter type: closed end flexible Catheter size: 19 Gauge Catheter at skin depth: 10 cm Test dose: negative and Other  Assessment Sensory level: T9 Events: blood not aspirated, injection not painful, no injection resistance, negative IV test and no paresthesia  Additional Notes Reason for block:procedure for pain

## 2014-08-08 NOTE — H&P (Signed)
Kristina LudwigMiranda [name] is a 29 y.o. female presenting for labor  29 yo G2P0010 @ 40+6 presents for labor. Her cervix is 5-6/c/0.  History OB History    Gravida Para Term Preterm AB TAB SAB Ectopic Multiple Living   2    1  1    0     Past Medical History  Diagnosis Date  . Irregular bleeding 05/06/2013  . Menometrorrhagia   . Asthma     mild with cold symptoms  . GERD (gastroesophageal reflux disease)     no symptoms over 1 yr  . Hx of varicella    Past Surgical History  Procedure Laterality Date  . Wisdom tooth extraction  2003  . Tonsillectomy and adenoidectomy      Just adenoids removed  . Upper gi endoscopy    . Dilation and evacuation N/A 09/01/2013    Procedure: DILATATION AND EVACUATION;  Surgeon: Bennye Almracy H Lathrop, MD;  Location: [facility];  Service: Gynecology;  Laterality: N/A;  . Tonsillectomy    . Dilation and curettage of uterus     Family History: family history includes Breast cancer (age of onset: 7454) in her maternal aunt; Diabetes Mellitus I in her paternal aunt; Diabetes Mellitus II in her maternal grandmother and mother; Fibroids in her mother; Heart attack in her maternal grandmother; Hypertension in her mother; Melanoma in her maternal grandfather; Osteoarthritis in her maternal grandmother; Skin cancer in her paternal grandfather and paternal grandmother. Social History:  reports that she has never smoked. She has never used smokeless tobacco. She reports that she drinks about 0.5 oz of alcohol per week. She reports that she does not use illicit drugs.   Prenatal Transfer Tool  Maternal Diabetes: No Genetic Screening: Normal Maternal Ultrasounds/Referrals: Normal Fetal Ultrasounds or other Referrals:  None Maternal Substance Abuse:  No Significant Maternal Medications:  None Significant Maternal Lab Results:  None Other Comments:  None  ROS: as above    Last menstrual period 10/26/2013. Exam Physical Exam  Prenatal labs: ABO, Rh: AB/Positive/-- (12/07  0000) Antibody: Negative (12/07 0000) Rubella: Immune (12/07 0000) RPR: Nonreactive (12/07 0000)  HBsAg: Negative (12/07 0000)  HIV: Non-reactive (12/07 0000)  GBS: Positive (12/07 0000)   Assessment/Plan: 1) Admit 2) Epidural 3) Amp for GBS   Breannah Kratt H. 08/08/2014, 11:17 AM

## 2014-08-08 NOTE — Anesthesia Preprocedure Evaluation (Signed)
Anesthesia Evaluation  Patient identified by MRN, date of birth, ID band Patient awake    Reviewed: Allergy & Precautions, H&P , Patient's Chart, lab work & pertinent test results  Airway Mallampati: II  TM Distance: >3 FB Neck ROM: full    Dental no notable dental hx.    Pulmonary    Pulmonary exam normal       Cardiovascular negative cardio ROS Normal cardiovascular exam    Neuro/Psych negative neurological ROS  negative psych ROS   GI/Hepatic Neg liver ROS,   Endo/Other  negative endocrine ROS  Renal/GU negative Renal ROS     Musculoskeletal   Abdominal Normal abdominal exam  (+)   Peds  Hematology negative hematology ROS (+)   Anesthesia Other Findings   Reproductive/Obstetrics (+) Pregnancy                             Anesthesia Physical Anesthesia Plan  ASA: II  Anesthesia Plan: Epidural   Post-op Pain Management:    Induction:   Airway Management Planned:   Additional Equipment:   Intra-op Plan:   Post-operative Plan:   Informed Consent: I have reviewed the patients History and Physical, chart, labs and discussed the procedure including the risks, benefits and alternatives for the proposed anesthesia with the patient or authorized representative who has indicated his/her understanding and acceptance.     Plan Discussed with:   Anesthesia Plan Comments:         Anesthesia Quick Evaluation

## 2014-08-08 NOTE — MAU Note (Signed)
Per HMitchell, RN charge, pt to go to room 164

## 2014-08-09 LAB — CBC
HEMATOCRIT: 26.4 % — AB (ref 36.0–46.0)
HEMOGLOBIN: 8.6 g/dL — AB (ref 12.0–15.0)
MCH: 25.1 pg — ABNORMAL LOW (ref 26.0–34.0)
MCHC: 32.2 g/dL (ref 30.0–36.0)
MCV: 78.1 fL (ref 78.0–100.0)
Platelets: 140 10*3/uL — ABNORMAL LOW (ref 150–400)
RBC: 3.38 MIL/uL — AB (ref 3.87–5.11)
RDW: 14.8 % (ref 11.5–15.5)
WBC: 11.3 10*3/uL — AB (ref 4.0–10.5)

## 2014-08-09 LAB — RPR: RPR Ser Ql: NONREACTIVE

## 2014-08-09 NOTE — Anesthesia Postprocedure Evaluation (Signed)
Anesthesia Post Note  Patient: Kristina [name]  Procedure(s) Performed: * No procedures listed *  Anesthesia type: Epidural  Patient location: Mother/Baby  Post pain: Pain level controlled  Post assessment: Post-op Vital signs reviewed  Last Vitals:  Filed Vitals:   08/09/14 0516  BP: 100/58  Pulse: 77  Temp: 37 C  Resp: 18    Post vital signs: Reviewed  Level of consciousness:alert  Complications: No apparent anesthesia complications

## 2014-08-09 NOTE — Progress Notes (Signed)
Post Partum Day 1 Subjective: no complaints, up ad lib, voiding and tolerating PO  Objective: Blood pressure 100/58, pulse 77, temperature 98.6 F (37 C), temperature source Oral, resp. rate 18, height 5\' 4"  (1.626 m), weight 67.586 kg (149 lb), last menstrual period 10/26/2013, SpO2 98 %, unknown if currently breastfeeding.  Physical Exam:  General: alert, cooperative and appears stated age Lochia: appropriate Uterine Fundus: firm   Recent Labs  08/08/14 1125 08/09/14 0525  HGB 10.8* 8.6*  HCT 33.0* 26.4*    Assessment/Plan: Plan for discharge tomorrow   LOS: 1 day   Manus Weedman H. 08/09/2014, 11:48 AM

## 2014-08-09 NOTE — Lactation Note (Signed)
This note was copied from the chart of Boy Kathi LudwigMiranda Daggs. Lactation Consultation Note Baby has been fussy on and off breast for feeding and FOB allowing baby to suck on gloved finger.  FOB burped baby and he settled.  Then baby was gaggy and had a large stool and void.  Reattempted breast feeding on right breast, side baby had difficulty on previously.  Baby latched well with audible gulping and mom denies pain.  Baby has been gaggy with stools and a little gassy, probably explains the behavior at the breast as baby has been breastfeeding well.  After 5 minutes feeding baby gaggy again, patted back no emesis noted.  Swaddled baby and asleep in moms arms.  Encouraged mom to hand express prior to latch and to alternate breasts for feedings.  Mom has pink nipples but denies more than mild pain.  Mom to call for assist as needed.    Patient Name: Boy Kathi LudwigMiranda Gucciardo ZOXWR'UToday's Date: 08/09/2014 Reason for consult: Follow-up assessment   Maternal Data    Feeding Feeding Type: Breast Fed Length of feed: 5 min  LATCH Score/Interventions Latch: Grasps breast easily, tongue down, lips flanged, rhythmical sucking.  Audible Swallowing: Spontaneous and intermittent Intervention(s): Skin to skin;Hand expression  Type of Nipple: Everted at rest and after stimulation  Comfort (Breast/Nipple): Filling, red/small blisters or bruises, mild/mod discomfort  Problem noted: Mild/Moderate discomfort  Hold (Positioning): Assistance needed to correctly position infant at breast and maintain latch. Intervention(s): Breastfeeding basics reviewed;Support Pillows;Position options;Skin to skin  LATCH Score: 8  Lactation Tools Discussed/Used     Consult Status Consult Status: Follow-up Date: 08/10/14    Jannifer RodneyShoptaw, Elaisha Zahniser Lynn 08/09/2014, 8:49 PM

## 2014-08-09 NOTE — Lactation Note (Signed)
This note was copied from the chart of [name]. Lactation Consultation Note  Patient Name: [name] WUJWJ'XToday's Date: 08/09/2014 Reason for consult: Initial assessment;Other (Comment) (baby circ'd at 1236 , presently sound asleep in crib , no signs of hunger )  Baby is 23 hours old and has been to the breast several times  LC reviewed some breast feeding basics 10 -45 mins , and several snacks, Latch scores range - 7-9 ,  voids and stools adequate for age. LC discussed the importance of skin to skin, especially if the baby hasn't shown feeding cues, or eaten in awhile.  Mom and dad are doing a great job keeping up with I/O's.  LC encouraged mom to call on the nurses light when baby showing feeding cues so a feeding assessment latch score can be done. Mother informed of post-discharge support and given phone number to the lactation department, including services for phone call assistance; out-patient appointments; and breastfeeding support group. List of other breastfeeding resources in the community given in the handout. Encouraged mother to call for problems or concerns related to breastfeeding.    Maternal Data Does the patient have breastfeeding experience prior to this delivery?: No  Feeding Feeding Type:  (last fed 10 mins at 1140 per mom ) Length of feed: 10 min  LATCH Score/Interventions Latch: Grasps breast easily, tongue down, lips flanged, rhythmical sucking.  Audible Swallowing: A few with stimulation  Type of Nipple: Everted at rest and after stimulation  Comfort (Breast/Nipple): Soft / non-tender     Hold (Positioning): No assistance needed to correctly position infant at breast. Intervention(s): Breastfeeding basics reviewed  LATCH Score: 9  Lactation Tools Discussed/Used     Consult Status Consult Status: Follow-up Date: 08/09/14 Follow-up type: In-patient    Kristina [name], Kristina [name] Ann 08/09/2014, 2:44 PM

## 2014-08-09 NOTE — Lactation Note (Signed)
This note was copied from the chart of Boy Kathi LudwigMiranda Linz. Lactation Consultation Note Follow up visit at 25 hours of age.  Baby has had 8 feedings with 10 stools and 6 voids.  Baby had circumcision today and is starting to show feeding cues.   Mom has everted nipples that appear pink and she reports they are a little tender.  Instructed on cross cradle hold on right breast.  Baby latched with ease and maintains rhythmic sucking with audible swallows.  After several minutes baby needs minimal stimulation to maintain total feeding of 20 minutes.  Mom denies pain but reports strong sucking.  Discussed supply and future pumping to return to work.  Questions answered.  Hand pump given and discussed engorgement care.  Mom to call for assist as needed.    Patient Name: Boy Kathi LudwigMiranda Tutson ZOXWR'UToday's Date: 08/09/2014 Reason for consult: Follow-up assessment   Maternal Data Has patient been taught Hand Expression?: Yes Does the patient have breastfeeding experience prior to this delivery?: No  Feeding Feeding Type: Breast Fed Length of feed: 20 min  LATCH Score/Interventions Latch: Grasps breast easily, tongue down, lips flanged, rhythmical sucking.  Audible Swallowing: Spontaneous and intermittent  Type of Nipple: Everted at rest and after stimulation  Comfort (Breast/Nipple): Filling, red/small blisters or bruises, mild/mod discomfort  Problem noted: Mild/Moderate discomfort  Hold (Positioning): Assistance needed to correctly position infant at breast and maintain latch. Intervention(s): Breastfeeding basics reviewed;Support Pillows;Position options;Skin to skin  LATCH Score: 8  Lactation Tools Discussed/Used Pump Review: Setup, frequency, and cleaning Initiated by:: JS   Consult Status Consult Status: Follow-up Date: 08/10/14 Follow-up type: In-patient    Beverely RisenShoptaw, Arvella MerlesJana Lynn 08/09/2014, 4:35 PM

## 2014-08-10 MED ORDER — IBUPROFEN 600 MG PO TABS: 600.0000 mg | ORAL_TABLET | Freq: Four times a day (QID) | ORAL | Status: DC

## 2014-08-10 NOTE — Discharge Instructions (Signed)
Pelvic rest x 6 weeks (no intercourse or tampons)   You have a mild anemia due to blood loss from delivery.  Please continue to take a prenatal vitamin daily and add an iron supplement (one tablet daily).  You may experience constipation from the iron, so add a stool softener as needed.    Postpartum Care After Vaginal Delivery After you deliver your newborn (postpartum period), the usual stay in the hospital is 24-72 hours. If there were problems with your labor or delivery, or if you have other medical problems, you might be in the hospital longer.  While you are in the hospital, you will receive help and instructions on how to care for yourself and your newborn during the postpartum period.  While you are in the hospital:  Be sure to tell your nurses if you have pain or discomfort, as well as where you feel the pain and what makes the pain worse.  If you had an incision made near your vagina (episiotomy) or if you had some tearing during delivery, the nurses may put ice packs on your episiotomy or tear. The ice packs may help to reduce the pain and swelling.  If you are breastfeeding, you may feel uncomfortable contractions of your uterus for a couple of weeks. This is normal. The contractions help your uterus get back to normal size.  It is normal to have some bleeding after delivery.  For the first 1-3 days after delivery, the flow is red and the amount may be similar to a period.  It is common for the flow to start and stop.  In the first few days, you may pass some small clots.  During the next 3-10 days after delivery, your flow should become more watery and pink or brown-tinged in color.  Ten to fourteen days after delivery, your flow should be a small amount of yellowish-white discharge.  The amount of your flow will decrease over the first few weeks after delivery. Your flow may stop in 6-8 weeks. Most women have had their flow stop by 12 weeks after delivery.  You should  change your sanitary pads frequently.  Wash your hands thoroughly with soap and water for at least 20 seconds after changing pads, using the toilet, or before holding or feeding your newborn.  You should feel like you need to empty your bladder within the first 6-8 hours after delivery.  In case you become weak, lightheaded, or faint, call your nurse before you get out of bed for the first time and before you take a shower for the first time.  Within the first few days after delivery, your breasts may begin to feel tender and full. This is called engorgement. Breast tenderness usually goes away within 48-72 hours after engorgement occurs. You may also notice milk leaking from your breasts. If you are not breastfeeding, do not stimulate your breasts. Breast stimulation can make your breasts produce more milk.  Spending as much time as possible with your newborn is very important. During this time, you and your newborn can feel close and get to know each other. Having your newborn stay in your room (rooming in) will help to strengthen the bond with your newborn. It will give you time to get to know your newborn and become comfortable caring for your newborn.  Your hormones change after delivery. Sometimes the hormone changes can temporarily cause you to feel sad or tearful. These feelings should not last more than a few days. If these  feelings last longer than that, you should talk to your caregiver.  If desired, talk to your caregiver about methods of family planning or contraception.  Talk to your caregiver about immunizations. Your caregiver may want you to have the following immunizations before leaving the hospital:  Tetanus, diphtheria, and pertussis (Tdap) or tetanus and diphtheria (Td) immunization. It is very important that you and your family (including grandparents) or others caring for your newborn are up-to-date with the Tdap or Td immunizations. The Tdap or Td immunization can help  protect your newborn from getting ill.  Rubella immunization.  Varicella (chickenpox) immunization.  Influenza immunization. You should receive this annual immunization if you did not receive the immunization during your pregnancy. Document Released: 11/13/2006 Document Revised: 10/11/2011 Document Reviewed: 09/13/2011 [facility] Patient Information 2015 [place], [place]. This information is not intended to replace advice given to you by your health care provider. Make sure you discuss any questions you have with your health care provider.

## 2014-08-10 NOTE — Discharge Summary (Addendum)
Obstetric Discharge Summary Reason for Admission: onset of labor Prenatal Procedures: none Intrapartum Procedures: spontaneous vaginal delivery Postpartum Procedures: none Complications-Operative and Postpartum: 2 degree perineal laceration HEMOGLOBIN  Date Value Ref Range Status  08/09/2014 8.6* 12.0 - 15.0 g/dL Final    Comment:    DELTA CHECK NOTED REPEATED TO VERIFY    HCT  Date Value Ref Range Status  08/09/2014 26.4* 36.0 - 46.0 % Final    Physical Exam:  General: alert, cooperative and appears stated age Lochia: appropriate Uterine Fundus: firm DVT Evaluation: No evidence of DVT seen on physical exam. Negative Homan's sign.  Discharge Diagnoses: Term Pregnancy-delivered  Discharge Information: Date: 08/10/2014 Activity: pelvic rest Diet: routine Medications: PNV, Ibuprofen, Colace and Iron (declined rx for percocet) Condition: stable Instructions: refer to practice specific booklet Discharge to: home Follow-up Information    Follow up with Kristina [name],Kristina H., MD In 4 weeks.   Specialty:  Obstetrics and Gynecology   Contact information:   [address] [place] [id number] [phone]       Newborn Data: Live born female  Birth Weight: 7 lb 7.6 oz (3390 g) APGAR: 9, 9  Home with mother.  Kristina [name] Kristina [name] 08/10/2014, 7:53 AM

## 2014-08-10 NOTE — Progress Notes (Signed)
Patient is doing well.  She is ambulating, voiding, tolerating PO.  Pain control is good.  Lochia is appropriate  Filed Vitals:   08/08/14 2201 08/09/14 0516 08/09/14 1819 08/10/14 0530  BP: 100/70 100/58 102/62 92/51  Pulse: 70 77 73 75  Temp: 98.3 F (36.8 C) 98.6 F (37 C) 98.8 F (37.1 C) 98.1 F (36.7 C)  TempSrc: Oral Oral Oral Oral  Resp: 16 18 18 18   Height:      Weight:      SpO2:        NAD Fundus firm Ext: no edema  Lab Results  Component Value Date   WBC 11.3* 08/09/2014   HGB 8.6* 08/09/2014   HCT 26.4* 08/09/2014   MCV 78.1 08/09/2014   PLT 140* 08/09/2014    --/--/AB POS, AB POS (07/09 1125)/RImmune  A/P 29 y.o. G2P1011 PPD#2. Routine care.  Meeting all goals.  D/c today ABLA on likely IDA--d/c w PO iron   Declines rx for percocet  [place] HosDYANNA GEFFEL Bridget Westbrooks

## 2014-08-10 NOTE — Lactation Note (Signed)
This note was copied from the chart of [name]. Lactation Consultation Note  Mother latched baby in cradle hold.  Nipples pink.  Admits milk soreness. Mother has comfort gels and reminded her about applying ebm. Assisted mother in repositioning to cross cradle for more depth. Sucks and swallows observed. Discussed engorgement care and monitoring voids/stools.   Patient Name: [name] UJWJX'BToday's Date: 08/10/2014 Reason for consult: Follow-up assessment   Maternal Data    Feeding Feeding Type: Breast Fed Length of feed: 15 min  LATCH Score/Interventions Latch: Grasps breast easily, tongue down, lips flanged, rhythmical sucking.  Audible Swallowing: Spontaneous and intermittent  Type of Nipple: Everted at rest and after stimulation  Comfort (Breast/Nipple): Soft / non-tender  Problem noted: Mild/Moderate discomfort  Hold (Positioning): Assistance needed to correctly position infant at breast and maintain latch.  LATCH Score: 9  Lactation Tools Discussed/Used     Consult Status Consult Status: Complete    Hardie PulleyBerkelhammer, Cobie Leidner Boschen 08/10/2014, 11:52 AM

## 2014-08-12 ENCOUNTER — Inpatient Hospital Stay (HOSPITAL_COMMUNITY): Admission: RE | Admit: 2014-08-12 | Payer: BC Managed Care – PPO | Source: Ambulatory Visit

## 2014-09-11 ENCOUNTER — Other Ambulatory Visit: Payer: Self-pay | Admitting: Obstetrics and Gynecology

## 2014-09-14 LAB — CYTOLOGY - PAP

## 2015-08-17 ENCOUNTER — Other Ambulatory Visit: Payer: Self-pay | Admitting: Obstetrics

## 2015-09-17 LAB — OB RESULTS CONSOLE ABO/RH: RH Type: POSITIVE

## 2015-09-17 LAB — OB RESULTS CONSOLE RUBELLA ANTIBODY, IGM: Rubella: IMMUNE

## 2015-09-17 LAB — OB RESULTS CONSOLE GC/CHLAMYDIA
CHLAMYDIA, DNA PROBE: NEGATIVE
GC PROBE AMP, GENITAL: NEGATIVE

## 2015-09-17 LAB — OB RESULTS CONSOLE HEPATITIS B SURFACE ANTIGEN: Hepatitis B Surface Ag: NEGATIVE

## 2015-09-17 LAB — OB RESULTS CONSOLE HIV ANTIBODY (ROUTINE TESTING): HIV: NONREACTIVE

## 2015-09-17 LAB — OB RESULTS CONSOLE RPR: RPR: NONREACTIVE

## 2015-09-17 LAB — OB RESULTS CONSOLE ANTIBODY SCREEN: Antibody Screen: NEGATIVE

## 2016-01-31 NOTE — L&D Delivery Note (Addendum)
Patient was C/C/+2 and pushed for approx 10 minutes with epidural.   NSVD female infant, Apgars 0/0- known fetal demise, weight pending.   The patient had no laceration. Fundus was firm. EBL was expected amount. Placenta was delivered intact. Vagina was clear.  Baby Susann GivensFranklin handed to mother,   Kristina [name], Kristina [name]

## 2016-03-03 ENCOUNTER — Other Ambulatory Visit: Payer: Self-pay | Admitting: Obstetrics & Gynecology

## 2016-03-04 ENCOUNTER — Inpatient Hospital Stay (HOSPITAL_COMMUNITY)
Admission: AD | Admit: 2016-03-04 | Discharge: 2016-03-06 | DRG: 774 | Disposition: A | Discharge status: Home / Self Care | Payer: BC Managed Care – PPO | Source: Ambulatory Visit | Attending: Obstetrics and Gynecology | Admitting: Obstetrics and Gynecology

## 2016-03-04 ENCOUNTER — Inpatient Hospital Stay (HOSPITAL_COMMUNITY): Payer: BC Managed Care – PPO

## 2016-03-04 ENCOUNTER — Encounter (HOSPITAL_COMMUNITY): Payer: Self-pay

## 2016-03-04 DIAGNOSIS — Z3A36 36 weeks gestation of pregnancy: Secondary | ICD-10-CM

## 2016-03-04 DIAGNOSIS — D696 Thrombocytopenia, unspecified: Secondary | ICD-10-CM | POA: Diagnosis not present

## 2016-03-04 DIAGNOSIS — O368130 Decreased fetal movements, third trimester, not applicable or unspecified: Secondary | ICD-10-CM | POA: Diagnosis present

## 2016-03-04 DIAGNOSIS — O364XX0 Maternal care for intrauterine death, not applicable or unspecified: Secondary | ICD-10-CM | POA: Diagnosis present

## 2016-03-04 LAB — CBC
HCT: 31.8 % — ABNORMAL LOW (ref 36.0–46.0)
Hemoglobin: 10.9 g/dL — ABNORMAL LOW (ref 12.0–15.0)
MCH: 28.5 pg (ref 26.0–34.0)
MCHC: 34.3 g/dL (ref 30.0–36.0)
MCV: 83 fL (ref 78.0–100.0)
Platelets: 122 10*3/uL — ABNORMAL LOW (ref 150–400)
RBC: 3.83 MIL/uL — ABNORMAL LOW (ref 3.87–5.11)
RDW: 13.6 % (ref 11.5–15.5)
WBC: 9 10*3/uL (ref 4.0–10.5)

## 2016-03-04 LAB — TYPE AND SCREEN
ABO/RH(D): AB POS
Antibody Screen: NEGATIVE

## 2016-03-04 MED ORDER — FENTANYL CITRATE (PF) 100 MCG/2ML IJ SOLN
100.0000 ug | INTRAMUSCULAR | Status: DC | PRN
  Administered 2016-03-05 (×2): 100 ug via INTRAVENOUS
  Filled 2016-03-04 (×2): qty 2

## 2016-03-04 MED ORDER — ONDANSETRON HCL 4 MG/2ML IJ SOLN: 4.0000 mg | Freq: Four times a day (QID) | INTRAMUSCULAR | Status: DC | PRN

## 2016-03-04 MED ORDER — OXYTOCIN BOLUS FROM INFUSION: 500.0000 mL | Freq: Once | INTRAVENOUS | Status: DC

## 2016-03-04 MED ORDER — FLEET ENEMA 7-19 GM/118ML RE ENEM: 1.0000 | ENEMA | RECTAL | Status: DC | PRN

## 2016-03-04 MED ORDER — OXYTOCIN 40 UNITS IN LACTATED RINGERS INFUSION - SIMPLE MED
2.5000 [IU]/h | INTRAVENOUS | Status: DC
  Filled 2016-03-04: qty 1000

## 2016-03-04 MED ORDER — OXYCODONE-ACETAMINOPHEN 5-325 MG PO TABS: 1.0000 | ORAL_TABLET | ORAL | Status: DC | PRN

## 2016-03-04 MED ORDER — LIDOCAINE HCL (PF) 1 % IJ SOLN
30.0000 mL | INTRAMUSCULAR | Status: DC | PRN
  Filled 2016-03-04: qty 30

## 2016-03-04 MED ORDER — MISOPROSTOL 50MCG HALF TABLET
50.0000 ug | ORAL_TABLET | ORAL | Status: AC | PRN
  Administered 2016-03-04 – 2016-03-05 (×3): 50 ug via VAGINAL
  Filled 2016-03-04 (×3): qty 0.5

## 2016-03-04 MED ORDER — LACTATED RINGERS IV SOLN: 500.0000 mL | INTRAVENOUS | Status: DC | PRN

## 2016-03-04 MED ORDER — SOD CITRATE-CITRIC ACID 500-334 MG/5ML PO SOLN: 30.0000 mL | ORAL | Status: DC | PRN

## 2016-03-04 MED ORDER — OXYCODONE-ACETAMINOPHEN 5-325 MG PO TABS: 2.0000 | ORAL_TABLET | ORAL | Status: DC | PRN

## 2016-03-04 MED ORDER — LACTATED RINGERS IV SOLN
INTRAVENOUS | Status: DC
  Administered 2016-03-04 – 2016-03-05 (×4): via INTRAVENOUS

## 2016-03-04 MED ORDER — ZOLPIDEM TARTRATE 5 MG PO TABS
5.0000 mg | ORAL_TABLET | Freq: Every evening | ORAL | Status: DC | PRN
  Administered 2016-03-04: 5 mg via ORAL
  Filled 2016-03-04: qty 1

## 2016-03-04 MED ORDER — MISOPROSTOL 200 MCG PO TABS: 200.0000 ug | ORAL_TABLET | ORAL | Status: DC | PRN

## 2016-03-04 MED ORDER — ACETAMINOPHEN 325 MG PO TABS
650.0000 mg | ORAL_TABLET | ORAL | Status: DC | PRN
  Administered 2016-03-04: 650 mg via ORAL
  Filled 2016-03-04: qty 2

## 2016-03-04 NOTE — MAU Provider Note (Signed)
Chief Complaint  Patient presents with  . Decreased Fetal Movement     First Provider Initiated Contact with Patient 03/04/16 1842      S: Kristina LudwigMiranda [name]  is a 31 y.o. y.o. year old 793P1011 female at 10422w0d weeks gestation who presents to MAU reporting no fetal movement since this afternoon. Baby was very active last night and is usually very active. No complications this pregnancy. Last prenatal visit yesterday was normal.   Contractions: None Vaginal bleeding: None Leaking of fluid: None  RN called CNM to BS urgently when unable to obtain FHR by EFM or doppler.   Patient Active Problem List   Diagnosis Date Noted  . Normal labor 08/08/2014  . Spontaneous vaginal delivery 08/08/2014  . Menometrorrhagia   . Irregular bleeding 05/06/2013    O: Patient Vitals for the past 24 hrs:  BP Temp Temp src Pulse Resp  03/04/16 1812 131/67 98.2 F (36.8 C) Oral 102 18   General: NAD Heart: Regular rate Lungs: Normal rate and effort Abd: Soft, NT, Gravid, S=D Pelvic: NEFG, no blood.  Dilation: Closed Effacement (%): Thick Cervical Position: Posterior Station: Ballotable Presentation: Vertex Exam by:: Dorathy KinsmanVirginia Anabeth Chilcott CNM  EFM: Unable to obtain FHR Toco: None  CNM performed informal BS US showing no cardiac activity w/ excellent visualization of heart. US called for STAT BS US and Dr. Debroah LoopArnold, Faculty Practice Attending called as will in case emergent C/S would be needed.   OB Limited No cardiac activity  A: 6822w0d week IUP IUFD  P: Pt request admission to BS for IOL.  Admit to L&D per consult w/ Philip AspenSidney Callahan, DO. Routine L&D orders. Dr. Claiborne Billingsallahan en route to see pt and discuss POC. Support given.  Offered Chaplain  [place] Deriona Altemose, [place] 03/04/2016 6:44 PM  2

## 2016-03-04 NOTE — H&P (Signed)
31 y.o. [redacted]w[redacted]d  G3P1011 comes in c/o decreased fetal movement.  She reports that normally the baby is very active and was active yesterday and last night.  Was seen in office yesterday and reported no problems.  Today she states she recalled the baby kicking out once around 1pm.  By approx 4pm she realized she hadn't felt normal movement and her husband called.  Advised that if [name] was not appropriate after having sugary drink and laying quietly with hand on belly to proceed to MAU for eval.  Pt denies bleeding or LOF.  She denies any other pregnancy problems to this point.  Past Medical History:  Diagnosis Date  . Asthma    mild with cold symptoms  . GERD (gastroesophageal reflux disease)    no symptoms over 1 yr  . Hx of varicella   . Irregular bleeding 05/06/2013  . Menometrorrhagia     Past Surgical History:  Procedure Laterality Date  . DILATION AND CURETTAGE OF UTERUS    . DILATION AND EVACUATION N/A 09/01/2013   Procedure: DILATATION AND EVACUATION;  Surgeon: Kristina Alm, MD;  Location: [facility];  Service: Gynecology;  Laterality: N/A;  . TONSILLECTOMY    . TONSILLECTOMY AND ADENOIDECTOMY     Just adenoids removed  . UPPER GI ENDOSCOPY    . WISDOM TOOTH EXTRACTION  2003    OB History  Gravida Para Term Preterm AB Living  3 1 1   1 1   SAB TAB Ectopic Multiple Live Births  1     0 1    # Outcome Date GA Lbr Len/2nd Weight Sex Delivery Anes PTL Lv  3 Current           2 Term 08/08/14 [redacted]w[redacted]d 12:17 / 01:36 3.39 kg (7 lb 7.6 oz) M Vag-Spont EPI  LIV     Birth Comments: none  1 SAB 2015 [redacted]w[redacted]d             Social History   Social History  . Marital status: Married    Spouse name: N/A  . Number of children: N/A  . Years of education: N/A   Occupational History  . Not on file.   Social History Main Topics  . Smoking status: Never Smoker  . Smokeless tobacco: Never Used  . Alcohol use 0.5 oz/week    1 Standard drinks or equivalent per week     Comment: rare  . Drug use: No   . Sexual activity: Yes    Partners: Male    Birth control/ protection: None   Other Topics Concern  . Not on file   Social History Narrative  . No narrative on file   Patient has no known allergies.    Prenatal Transfer Tool  Maternal Diabetes: No Genetic Screening: Declined Maternal Ultrasounds/Referrals: Normal Fetal Ultrasounds or other Referrals:  None Maternal Substance Abuse:  No Significant Maternal Medications:  None Significant Maternal Lab Results: None  Other PNC: uncomplicated.    Vitals:   03/04/16 1812  BP: 131/67  Pulse: 102  Resp: 18  Temp: 98.2 F (36.8 C)     Lungs/Cor:  NAD Abdomen:  soft, gravid Ex:  no cords, erythema SVE:  0/thick/posterior FHTs:  Absent by doppler and absent heart motion by formal US in MAU Toco:  none   A/P   Admit for IOL for 36 week fetal demise  Will start cytotec and proceed with pitocin as needed  IV pain meds/NO3/epidural all ok  Ambien ok for  sleep  Reiterated chaplain sevices and advised add'l grief support resources will be reviewed  Discussed briefly options for evaluation of cause, will defer more detailed discussion until pt and husband are ready  GBS not done  [place], Kristina [name] LPIDNEY

## 2016-03-04 NOTE — MAU Note (Signed)
Patient presents with no FM, unable to obtain efm fhr, Kristina [name] CNM at bedside with ultrasound. Called for stat bedside [place]

## 2016-03-04 NOTE — MAU Note (Signed)
Pt requesting for privacy at this time. Call bell given to patient with instructions to call out as needed.

## 2016-03-04 NOTE — Progress Notes (Signed)
I went in to patients room and introduced myself and gave support. Patient seemed to be calm but sad. I am given patient time to get her self together and then we will start induction processes. I told patient to call out when she was ready for me to come back.

## 2016-03-04 NOTE — Progress Notes (Signed)
Pt was sitting up in bed, tearful and alone when I arrived. Her husband had stepped out to eat. Pt said she was at a loss for words. CH listened as she lamented and spoke of her (and her husband's) parents. They were expecting a boy and his name is [place]. We talked about Susann GivensFranklin being with God. Because of her Catholic faith, she appreciated the conversation. When her husband returned we shared our conversation with him and he said his mom had also shared the same with him while he was out. He was very, but appropriately, tearful while still offering support to his wife. They were tearful through prayer, but very grateful. I encouraged them to ask if they needed additional support. Please page if needed.  Chaplain Marjory LiesPamela Carrington Holder, M.Div.   03/04/16 2000  Clinical Encounter Type  Visited With Patient and family together

## 2016-03-05 ENCOUNTER — Inpatient Hospital Stay (HOSPITAL_COMMUNITY): Payer: BC Managed Care – PPO | Admitting: Anesthesiology

## 2016-03-05 ENCOUNTER — Encounter (HOSPITAL_COMMUNITY): Payer: Self-pay | Admitting: *Deleted

## 2016-03-05 LAB — CBC
HEMATOCRIT: 30.1 % — AB (ref 36.0–46.0)
HEMATOCRIT: 29.6 % — AB (ref 36.0–46.0)
HEMOGLOBIN: 10.2 g/dL — AB (ref 12.0–15.0)
HEMOGLOBIN: 10 g/dL — AB (ref 12.0–15.0)
MCH: 28 pg (ref 26.0–34.0)
MCH: 28.2 pg (ref 26.0–34.0)
MCHC: 33.9 g/dL (ref 30.0–36.0)
MCHC: 33.8 g/dL (ref 30.0–36.0)
MCV: 82.7 fL (ref 78.0–100.0)
MCV: 83.4 fL (ref 78.0–100.0)
PLATELETS: 93 10*3/uL — AB (ref 150–400)
Platelets: 95 10*3/uL — ABNORMAL LOW (ref 150–400)
RBC: 3.64 MIL/uL — AB (ref 3.87–5.11)
RBC: 3.55 MIL/uL — ABNORMAL LOW (ref 3.87–5.11)
RDW: 13.5 % (ref 11.5–15.5)
RDW: 13.6 % (ref 11.5–15.5)
WBC: 7 10*3/uL (ref 4.0–10.5)
WBC: 8.4 10*3/uL (ref 4.0–10.5)

## 2016-03-05 LAB — DIC (DISSEMINATED INTRAVASCULAR COAGULATION) PANEL
D DIMER QUANT: 1.71 ug{FEU}/mL — AB (ref 0.00–0.50)
INR: 0.99
PLATELETS: 95 10*3/uL — AB (ref 150–400)
SMEAR REVIEW: NONE SEEN

## 2016-03-05 LAB — FIBRINOGEN: Fibrinogen: 422 mg/dL (ref 210–475)

## 2016-03-05 LAB — COMPREHENSIVE METABOLIC PANEL
ALK PHOS: 113 U/L (ref 38–126)
ALT: 11 U/L — AB (ref 14–54)
AST: 19 U/L (ref 15–41)
Albumin: 2.7 g/dL — ABNORMAL LOW (ref 3.5–5.0)
Anion gap: 7 (ref 5–15)
BILIRUBIN TOTAL: 1 mg/dL (ref 0.3–1.2)
CALCIUM: 8.5 mg/dL — AB (ref 8.9–10.3)
CO2: 23 mmol/L (ref 22–32)
CREATININE: 0.5 mg/dL (ref 0.44–1.00)
Chloride: 105 mmol/L (ref 101–111)
Glucose, Bld: 84 mg/dL (ref 65–99)
Potassium: 3.8 mmol/L (ref 3.5–5.1)
Sodium: 135 mmol/L (ref 135–145)
TOTAL PROTEIN: 5.8 g/dL — AB (ref 6.5–8.1)

## 2016-03-05 LAB — SAVE SMEAR: Smear Review: NONE SEEN

## 2016-03-05 LAB — PROTIME-INR
INR: 1.01
PROTHROMBIN TIME: 13.3 s (ref 11.4–15.2)

## 2016-03-05 LAB — RPR: RPR: NONREACTIVE

## 2016-03-05 LAB — DIC (DISSEMINATED INTRAVASCULAR COAGULATION)PANEL
Fibrinogen: 402 mg/dL (ref 210–475)
Prothrombin Time: 13.1 seconds (ref 11.4–15.2)
aPTT: 27 seconds (ref 24–36)

## 2016-03-05 LAB — APTT: aPTT: 25 seconds (ref 24–36)

## 2016-03-05 MED ORDER — PHENYLEPHRINE 40 MCG/ML (10ML) SYRINGE FOR IV PUSH (FOR BLOOD PRESSURE SUPPORT)
80.0000 ug | PREFILLED_SYRINGE | INTRAVENOUS | Status: DC | PRN
  Filled 2016-03-05: qty 5
  Filled 2016-03-05: qty 10

## 2016-03-05 MED ORDER — PHENYLEPHRINE 40 MCG/ML (10ML) SYRINGE FOR IV PUSH (FOR BLOOD PRESSURE SUPPORT)
80.0000 ug | PREFILLED_SYRINGE | INTRAVENOUS | Status: DC | PRN
  Filled 2016-03-05: qty 5

## 2016-03-05 MED ORDER — LACTATED RINGERS IV SOLN
500.0000 mL | Freq: Once | INTRAVENOUS | Status: DC
Start: 2016-03-05 — End: 2016-03-06

## 2016-03-05 MED ORDER — FENTANYL 2.5 MCG/ML BUPIVACAINE 1/10 % EPIDURAL INFUSION (WH - ANES)
14.0000 mL/h | INTRAMUSCULAR | Status: DC | PRN
Start: 2016-03-05 — End: 2016-03-06
  Administered 2016-03-05 (×2): 14 mL/h via EPIDURAL
  Filled 2016-03-05 (×2): qty 100

## 2016-03-05 MED ORDER — MISOPROSTOL 100 MCG PO TABS
100.0000 ug | ORAL_TABLET | Freq: Once | ORAL | Status: AC
  Administered 2016-03-05: 100 ug via VAGINAL
  Filled 2016-03-05: qty 1

## 2016-03-05 MED ORDER — DIPHENHYDRAMINE HCL 50 MG/ML IJ SOLN: 12.5000 mg | INTRAMUSCULAR | Status: DC | PRN

## 2016-03-05 MED ORDER — EPHEDRINE 5 MG/ML INJ
10.0000 mg | INTRAVENOUS | Status: DC | PRN
  Filled 2016-03-05: qty 4

## 2016-03-05 MED ORDER — LIDOCAINE HCL (PF) 1 % IJ SOLN
INTRAMUSCULAR | Status: DC | PRN
  Administered 2016-03-05: 5 mL via EPIDURAL
  Administered 2016-03-05: 2 mL via EPIDURAL
  Administered 2016-03-05: 3 mL via EPIDURAL

## 2016-03-05 MED ORDER — OXYTOCIN 40 UNITS IN LACTATED RINGERS INFUSION - SIMPLE MED
1.0000 m[IU]/min | INTRAVENOUS | Status: DC
  Administered 2016-03-05: 2 m[IU]/min via INTRAVENOUS

## 2016-03-05 NOTE — Anesthesia Preprocedure Evaluation (Signed)
Anesthesia Evaluation  Patient identified by MRN, date of birth, ID band Patient awake    Reviewed: Allergy & Precautions, NPO status , Patient's Chart, lab work & pertinent test results  Airway Mallampati: II  TM Distance: >3 FB Neck ROM: Full    Dental  (+) Teeth Intact, Dental Advisory Given   Pulmonary asthma ,    Pulmonary exam normal breath sounds clear to auscultation       Cardiovascular negative cardio ROS Normal cardiovascular exam Rhythm:Regular Rate:Normal     Neuro/Psych negative neurological ROS     GI/Hepatic negative GI ROS, Neg liver ROS,   Endo/Other  negative endocrine ROS  Renal/GU negative Renal ROS     Musculoskeletal negative musculoskeletal ROS (+)   Abdominal   Peds  Hematology negative hematology ROS (+) Blood dyscrasia (Plt 93k), anemia ,   Anesthesia Other Findings Day of surgery medications reviewed with the patient.  Reproductive/Obstetrics (+) Pregnancy (36wk fetal demise)                             Anesthesia Physical Anesthesia Plan  ASA: II  Anesthesia Plan: Epidural   Post-op Pain Management:    Induction:   Airway Management Planned:   Additional Equipment:   Intra-op Plan:   Post-operative Plan:   Informed Consent: I have reviewed the patients History and Physical, chart, labs and discussed the procedure including the risks, benefits and alternatives for the proposed anesthesia with the patient or authorized representative who has indicated his/her understanding and acceptance.   Dental advisory given  Plan Discussed with:   Anesthesia Plan Comments: (Patient identified. Risks/Benefits/Options discussed with patient including but not limited to bleeding, infection, nerve damage, paralysis, failed block, incomplete pain control, headache, blood pressure changes, nausea, vomiting, reactions to medication both or allergic, itching and  postpartum back pain. Confirmed with bedside nurse the patient's most recent platelet count. Confirmed with patient that they are not currently taking any anticoagulation, have any bleeding history or any family history of bleeding disorders. Patient expressed understanding and wished to proceed. All questions were answered. )        Anesthesia Quick Evaluation

## 2016-03-05 NOTE — Progress Notes (Signed)
Spoke with Dr. Desmond Lopeurk about pts recent platlet count. He suggest since pt has had such decrease since last night it would be prudent to go ahead with epidural now. Pt N of this and is agreeable to proceeding with epidural now.

## 2016-03-05 NOTE — Progress Notes (Signed)
N Dr. Claiborne Billingsallahan of CBC result req additional labs MD agreeable to CMP and DIC panel. Orders rec for pitocin.

## 2016-03-05 NOTE — Anesthesia Procedure Notes (Signed)
Epidural Patient location during procedure: OB Start time: 03/05/2016 12:17 PM End time: 03/05/2016 12:22 PM  Staffing Anesthesiologist: Cecile HearingURK, STEPHEN EDWARD Performed: anesthesiologist   Preanesthetic Checklist Completed: patient identified, pre-op evaluation, timeout performed, IV checked, risks and benefits discussed and monitors and equipment checked  Epidural Patient position: sitting Prep: DuraPrep Patient monitoring: blood pressure and continuous pulse ox Approach: midline Location: L3-L4 Injection technique: LOR air  Needle:  Needle type: Tuohy  Needle gauge: 17 G Needle length: 9 cm Needle insertion depth: 4 cm Catheter size: 19 Gauge Catheter at skin depth: 9 cm Test dose: negative and Other (1% Lidocaine)  Additional Notes Patient identified.  Risk benefits discussed including failed block, incomplete pain control, headache, nerve damage, paralysis, blood pressure changes, nausea, vomiting, reactions to medication both toxic or allergic, and postpartum back pain.  Patient expressed understanding and wished to proceed.  All questions were answered.  Sterile technique used throughout procedure and epidural site dressed with sterile barrier dressing. No paresthesia or other complications noted. The patient did not experience any signs of intravascular injection such as tinnitus or metallic taste in mouth nor signs of intrathecal spread such as rapid motor block. Please see nursing notes for vital signs. Reason for block:procedure for pain

## 2016-03-05 NOTE — Progress Notes (Signed)
Pt remains tearful but appropriate in responses and conversant.  She received 3 doses of cytotec overnight with dilation to 1cm and 70-80% effaced.  Will admin once add'l dose of cytotec and reassess for transition to pitocin for labor augmentation. Reviewed options for assessing causation of fetal demise.  Pt and husband will consider and if no obvious reason noted at delivery may decide to pursue some add'l testing.

## 2016-03-06 ENCOUNTER — Encounter (HOSPITAL_COMMUNITY): Payer: Self-pay

## 2016-03-06 LAB — CBC
HEMATOCRIT: 30.5 % — AB (ref 36.0–46.0)
HEMOGLOBIN: 10.4 g/dL — AB (ref 12.0–15.0)
MCH: 28.4 pg (ref 26.0–34.0)
MCHC: 34.1 g/dL (ref 30.0–36.0)
MCV: 83.3 fL (ref 78.0–100.0)
PLATELETS: 121 10*3/uL — AB (ref 150–400)
RBC: 3.66 MIL/uL — AB (ref 3.87–5.11)
RDW: 13.7 % (ref 11.5–15.5)
WBC: 8 10*3/uL (ref 4.0–10.5)

## 2016-03-06 LAB — T4, FREE: Free T4: 0.69 ng/dL (ref 0.61–1.12)

## 2016-03-06 LAB — TSH: TSH: 3.301 u[IU]/mL (ref 0.350–4.500)

## 2016-03-06 MED ORDER — ZOLPIDEM TARTRATE 5 MG PO TABS: 5.0000 mg | ORAL_TABLET | Freq: Every evening | ORAL | Status: DC | PRN

## 2016-03-06 MED ORDER — BENZOCAINE-MENTHOL 20-0.5 % EX AERO: 1.0000 "application " | INHALATION_SPRAY | CUTANEOUS | Status: DC | PRN

## 2016-03-06 MED ORDER — IBUPROFEN 600 MG PO TABS: 600.0000 mg | ORAL_TABLET | Freq: Four times a day (QID) | ORAL | 0 refills | Status: AC

## 2016-03-06 MED ORDER — SIMETHICONE 80 MG PO CHEW: 80.0000 mg | CHEWABLE_TABLET | ORAL | Status: DC | PRN

## 2016-03-06 MED ORDER — IBUPROFEN 600 MG PO TABS
600.0000 mg | ORAL_TABLET | Freq: Four times a day (QID) | ORAL | Status: DC
  Administered 2016-03-06 (×2): 600 mg via ORAL
  Filled 2016-03-06 (×2): qty 1

## 2016-03-06 MED ORDER — WITCH HAZEL-GLYCERIN EX PADS: 1.0000 "application " | MEDICATED_PAD | CUTANEOUS | Status: DC | PRN

## 2016-03-06 MED ORDER — TETANUS-DIPHTH-ACELL PERTUSSIS 5-2.5-18.5 LF-MCG/0.5 IM SUSP: 0.5000 mL | Freq: Once | INTRAMUSCULAR | Status: DC

## 2016-03-06 MED ORDER — COCONUT OIL OIL: 1.0000 "application " | TOPICAL_OIL | Status: DC | PRN

## 2016-03-06 MED ORDER — SENNOSIDES-DOCUSATE SODIUM 8.6-50 MG PO TABS
2.0000 | ORAL_TABLET | ORAL | Status: DC
  Administered 2016-03-06: 2 via ORAL
  Filled 2016-03-06: qty 2

## 2016-03-06 MED ORDER — OXYCODONE-ACETAMINOPHEN 5-325 MG PO TABS
1.0000 | ORAL_TABLET | ORAL | Status: DC | PRN
Start: 2016-03-06 — End: 2016-03-06

## 2016-03-06 MED ORDER — DIBUCAINE 1 % RE OINT: 1.0000 "application " | TOPICAL_OINTMENT | RECTAL | Status: DC | PRN

## 2016-03-06 MED ORDER — DIPHENHYDRAMINE HCL 25 MG PO CAPS: 25.0000 mg | ORAL_CAPSULE | Freq: Four times a day (QID) | ORAL | Status: DC | PRN

## 2016-03-06 MED ORDER — ONDANSETRON HCL 4 MG PO TABS: 4.0000 mg | ORAL_TABLET | ORAL | Status: DC | PRN

## 2016-03-06 MED ORDER — ACETAMINOPHEN 325 MG PO TABS
650.0000 mg | ORAL_TABLET | ORAL | Status: DC | PRN
Start: 2016-03-06 — End: 2016-03-06

## 2016-03-06 MED ORDER — PRENATAL MULTIVITAMIN CH
1.0000 | ORAL_TABLET | Freq: Every day | ORAL | Status: DC
  Administered 2016-03-06: 1 via ORAL
  Filled 2016-03-06: qty 1

## 2016-03-06 MED ORDER — OXYCODONE-ACETAMINOPHEN 5-325 MG PO TABS: 2.0000 | ORAL_TABLET | ORAL | Status: DC | PRN

## 2016-03-06 MED ORDER — ONDANSETRON HCL 4 MG/2ML IJ SOLN: 4.0000 mg | INTRAMUSCULAR | Status: DC | PRN

## 2016-03-06 NOTE — Progress Notes (Signed)
MD called regarding pt platelet count and plan of care for the evening.  No new orders given will continue to monitor pt closely.  Anesthesia informed of pt platelet count as well new orders given to recheck at 2300.  Anesthesia informed of results once resulted and instructed to leave epidural in place, will recheck CBC in AM.

## 2016-03-06 NOTE — Consult Note (Signed)
Mom requested to see a lactation consultant to discuss comfort measures if breasts become uncomfortable with milk coming in.  We discussed using cabbage leaves, ice and ibuprofen as directed.  Other methods to decrease milk production could be sage tea and OTC cold meds as directed.  Lactation After Loss handout given along with phone number for lactation office.

## 2016-03-06 NOTE — Progress Notes (Signed)
   03/06/16 0327  Vital Signs  BP (!) 100/54  BP Location Left Arm  Patient Position (if appropriate) Semi-fowlers  BP Method Automatic  Pulse Rate 84  Pulse Rate Source Dinamap  Resp 16  Temp 98.5 F (36.9 C)  Temp Source Oral  Oxygen Therapy  SpO2 100 %  O2 Device Room Air  [facility]  awake, alert and oriented x 3. Pt denies pain or discomfort but appears sad and tired. We extended empathy to patient and her husband and they both responded "than you". We requested a Courtesy Tray and settled patient comfortable to bed. We will continue to monitor.

## 2016-03-06 NOTE — Discharge Summary (Signed)
Obstetric Discharge Summary Reason for Admission: induction of labor and IUFD at 36 weeks Prenatal Procedures: none Intrapartum Procedures: spontaneous vaginal delivery Postpartum Procedures: none Complications-Operative and Postpartum: none Hemoglobin  Date Value Ref Range Status  03/05/2016 10.0 (L) 12.0 - 15.0 g/dL Final   Hemoglobin, fingerstick  Date Value Ref Range Status  05/06/2013 12.5 12.0 - 16.0 g/dL Final   HCT  Date Value Ref Range Status  03/05/2016 29.6 (L) 36.0 - 46.0 % Final    Physical Exam:  General: alert, cooperative and appears stated age Lochia: appropriate Uterine Fundus: firm DVT Evaluation: No evidence of DVT seen on physical exam. Negative Homan's sign.  Discharge Diagnoses: IUFD 36w  Discharge Information: Date: 03/06/2016 Activity: pelvic rest Diet: routine Medications: Ibuprofen Condition: stable Instructions: refer to practice specific booklet Discharge to: home Follow-up Information    CALLAHAN, SIDNEY, DO Follow up in 2 week(s).   Specialty:  Obstetrics and Gynecology Contact information: [address] [place] [address] [phone]           Newborn Data: Josefine ClassStillborn female infant Birth Weight: 6 lb 1.5 oz (2765 g) APGAR: 0, 0     Caralina Nop GEFFEL Nathanyal Ashmead 03/06/2016, 9:50 AM

## 2016-03-06 NOTE — Anesthesia Postprocedure Evaluation (Signed)
Anesthesia Post Note  Patient: Kristina LudwigMiranda [name]  Procedure(s) Performed: * No procedures listed *  Patient location during evaluation: [facility] Anesthesia Type: Epidural Level of consciousness: awake and alert and oriented Pain management: pain level controlled Vital Signs Assessment: post-procedure vital signs reviewed and stable Respiratory status: spontaneous breathing and nonlabored ventilation Cardiovascular status: stable Postop Assessment: no headache, no backache, patient able to bend at knees, epidural receding, no signs of nausea or vomiting and adequate PO intake Anesthetic complications: no        Last Vitals:  Vitals:   03/06/16 0427 03/06/16 0751  BP: (!) 97/57 (!) 98/54  Pulse: 87 79  Resp: 18 18  Temp: 36.8 C 36.8 C    Last Pain:  Vitals:   03/06/16 0825  TempSrc:   PainSc: 0-No pain   Pain Goal: Patients Stated Pain Goal: 2 (03/06/16 0606)               Laban EmperorMalinova,Haidee Stogsdill Hristova

## 2016-03-06 NOTE — Progress Notes (Signed)
Patient is doing well.  She is ambulating, voiding, tolerating PO.  Pain control is good.  Lochia is appropriate. Husband at bedside--they are grieving appropriately.  Good support.  Planning service for baby Susann [place].   During her induction, had declined amnio, autopsy and also placental evaluation.  Per Dr. Claiborne Billingsallahan, baby and placenta were grossly normal at delivery.    Vitals:   03/06/16 0315 03/06/16 0327 03/06/16 0427 03/06/16 0751  BP: (!) 104/59 (!) 100/54 (!) 97/57 (!) 98/54  Pulse: 84 84 87 79  Resp: 16 16 18 18   Temp: 98.8 F (37.1 C) 98.5 F (36.9 C) 98.2 F (36.8 C) 98.3 F (36.8 C)  TempSrc: Oral Oral Oral Oral  SpO2:  100% 100% 99%  Weight:      Height:        NAD Fundus firm Ext: no edema  Lab Results  Component Value Date   WBC 8.4 03/05/2016   HGB 10.0 (L) 03/05/2016   HCT 29.6 (L) 03/05/2016   MCV 83.4 03/05/2016   PLT 95 (L) 03/05/2016   PLT 95 (L) 03/05/2016    --/--/AB POS (02/03 2053)/RImmune  A/P 30 y.o. [id number] PPD#1 s/p IOL for 36wk IUFD. Declined autopsy / karyotype evaluation Discussed again w pt and husband today at least some additional lab work up that could impact her care in a future pregnancy--they agree to TFTs / APA w/u.  Had testing for parvo mid-pregnancy and was immune  Thrombocytopenia--stabilized 95 over night--will repeat again.  If stable, will d/c to home Discussed heartstrings 2wk PP visit   Leroy Trim GEFFEL Javanni Maring

## 2016-03-07 LAB — LUPUS ANTICOAGULANT
DPT: 38.8 s (ref 0.0–55.0)
DRVVT: 34.5 s (ref 0.0–47.0)
PTT LA: 33.2 s (ref 0.0–51.9)
Thrombin Time: 17.6 s (ref 0.0–23.0)
dPT Confirm Ratio: 1.08 Ratio (ref 0.00–1.40)

## 2016-03-07 LAB — CARDIOLIPIN ANTIBODIES, IGG, IGM, IGA
Anticardiolipin IgA: <9 U/mL (ref 0–11)
Anticardiolipin IgG: <9 GPL U/mL (ref 0–14)
Anticardiolipin IgM: <9 [MPL'U]/mL (ref 0–12)

## 2016-03-07 LAB — BETA 2 MICROGLOBULIN, SERUM: Beta-2 Microglobulin: 1.2 mg/L (ref 0.6–2.4)

## 2016-03-16 ENCOUNTER — Encounter (HOSPITAL_COMMUNITY): Payer: Self-pay | Admitting: *Deleted

## 2016-10-04 LAB — OB RESULTS CONSOLE HIV ANTIBODY (ROUTINE TESTING): HIV: NONREACTIVE

## 2016-10-04 LAB — OB RESULTS CONSOLE GC/CHLAMYDIA
Chlamydia: NEGATIVE
Gonorrhea: NEGATIVE

## 2016-10-04 LAB — OB RESULTS CONSOLE ABO/RH: RH TYPE: POSITIVE

## 2016-10-04 LAB — OB RESULTS CONSOLE HEPATITIS B SURFACE ANTIGEN: HEP B S AG: NEGATIVE

## 2016-10-04 LAB — OB RESULTS CONSOLE RUBELLA ANTIBODY, IGM: Rubella: IMMUNE

## 2016-10-04 LAB — OB RESULTS CONSOLE ANTIBODY SCREEN: ANTIBODY SCREEN: NEGATIVE

## 2016-10-04 LAB — OB RESULTS CONSOLE RPR: RPR: NONREACTIVE

## 2017-01-30 NOTE — L&D Delivery Note (Signed)
Patient was C/C/+3 and pushed for 1 minute with epidural.    NSVD  female infant, Apgars 8,9, weight P.   The patient had no lacerations. Fundus was firm. EBL was expected amount. Placenta was delivered intact. Vagina was clear.  Delayed cord clamping done for 30-60 seconds while warming baby. Baby was vigorous and doing skin to skin with mother.  Staria Birkhead A

## 2017-03-29 LAB — OB RESULTS CONSOLE GBS: GBS: NEGATIVE

## 2017-04-02 ENCOUNTER — Other Ambulatory Visit: Payer: Self-pay | Admitting: Obstetrics and Gynecology

## 2017-04-04 ENCOUNTER — Telehealth (HOSPITAL_COMMUNITY): Payer: Self-pay | Admitting: *Deleted

## 2017-04-04 ENCOUNTER — Encounter (HOSPITAL_COMMUNITY): Payer: Self-pay | Admitting: *Deleted

## 2017-04-04 NOTE — Telephone Encounter (Signed)
Preadmission screen  

## 2017-04-10 ENCOUNTER — Telehealth (HOSPITAL_COMMUNITY): Payer: Self-pay | Admitting: *Deleted

## 2017-04-10 NOTE — Telephone Encounter (Signed)
Preadmission screen  

## 2017-04-12 ENCOUNTER — Inpatient Hospital Stay (HOSPITAL_COMMUNITY): Payer: BC Managed Care – PPO | Admitting: Anesthesiology

## 2017-04-12 ENCOUNTER — Other Ambulatory Visit: Payer: Self-pay

## 2017-04-12 ENCOUNTER — Inpatient Hospital Stay (HOSPITAL_COMMUNITY)
Admission: RE | Admit: 2017-04-12 | Discharge: 2017-04-14 | DRG: 805 | Disposition: A | Discharge status: Home / Self Care | Payer: BC Managed Care – PPO | Source: Ambulatory Visit | Attending: Obstetrics and Gynecology | Admitting: Obstetrics and Gynecology

## 2017-04-12 ENCOUNTER — Encounter (HOSPITAL_COMMUNITY): Payer: Self-pay

## 2017-04-12 DIAGNOSIS — K831 Obstruction of bile duct: Secondary | ICD-10-CM | POA: Diagnosis present

## 2017-04-12 DIAGNOSIS — Z3A37 37 weeks gestation of pregnancy: Secondary | ICD-10-CM | POA: Diagnosis not present

## 2017-04-12 DIAGNOSIS — O2662 Liver and biliary tract disorders in childbirth: Secondary | ICD-10-CM | POA: Diagnosis present

## 2017-04-12 LAB — CBC
HEMATOCRIT: 32.7 % — AB (ref 36.0–46.0)
Hemoglobin: 11.1 g/dL — ABNORMAL LOW (ref 12.0–15.0)
MCH: 30.3 pg (ref 26.0–34.0)
MCHC: 33.9 g/dL (ref 30.0–36.0)
MCV: 89.3 fL (ref 78.0–100.0)
Platelets: 135 10*3/uL — ABNORMAL LOW (ref 150–400)
RBC: 3.66 MIL/uL — ABNORMAL LOW (ref 3.87–5.11)
RDW: 13.4 % (ref 11.5–15.5)
WBC: 7.1 10*3/uL (ref 4.0–10.5)

## 2017-04-12 LAB — RPR: RPR: NONREACTIVE

## 2017-04-12 LAB — TYPE AND SCREEN
ABO/RH(D): AB POS
ANTIBODY SCREEN: NEGATIVE

## 2017-04-12 MED ORDER — LACTATED RINGERS IV SOLN
INTRAVENOUS | Status: DC
  Administered 2017-04-12 (×4): via INTRAVENOUS

## 2017-04-12 MED ORDER — DIPHENHYDRAMINE HCL 50 MG/ML IJ SOLN: 12.5000 mg | INTRAMUSCULAR | Status: DC | PRN

## 2017-04-12 MED ORDER — LACTATED RINGERS IV SOLN: 500.0000 mL | Freq: Once | INTRAVENOUS | Status: DC

## 2017-04-12 MED ORDER — EPHEDRINE 5 MG/ML INJ: 10.0000 mg | INTRAVENOUS | Status: DC | PRN

## 2017-04-12 MED ORDER — PHENYLEPHRINE 40 MCG/ML (10ML) SYRINGE FOR IV PUSH (FOR BLOOD PRESSURE SUPPORT): 80.0000 ug | PREFILLED_SYRINGE | INTRAVENOUS | Status: DC | PRN

## 2017-04-12 MED ORDER — PHENYLEPHRINE 40 MCG/ML (10ML) SYRINGE FOR IV PUSH (FOR BLOOD PRESSURE SUPPORT)
80.0000 ug | PREFILLED_SYRINGE | INTRAVENOUS | Status: DC | PRN
  Filled 2017-04-12: qty 5

## 2017-04-12 MED ORDER — BUPIVACAINE HCL (PF) 0.25 % IJ SOLN
INTRAMUSCULAR | Status: DC | PRN
  Administered 2017-04-12 (×2): 4 mL via EPIDURAL

## 2017-04-12 MED ORDER — PHENYLEPHRINE 40 MCG/ML (10ML) SYRINGE FOR IV PUSH (FOR BLOOD PRESSURE SUPPORT)
80.0000 ug | PREFILLED_SYRINGE | INTRAVENOUS | Status: DC | PRN
  Filled 2017-04-12: qty 10
  Filled 2017-04-12: qty 5

## 2017-04-12 MED ORDER — EPHEDRINE 5 MG/ML INJ
10.0000 mg | INTRAVENOUS | Status: DC | PRN
  Filled 2017-04-12: qty 2

## 2017-04-12 MED ORDER — ACETAMINOPHEN 325 MG PO TABS
650.0000 mg | ORAL_TABLET | ORAL | Status: DC | PRN
Start: 2017-04-12 — End: 2017-04-13

## 2017-04-12 MED ORDER — OXYTOCIN 40 UNITS IN LACTATED RINGERS INFUSION - SIMPLE MED
1.0000 m[IU]/min | INTRAVENOUS | Status: DC
  Administered 2017-04-12: 2 m[IU]/min via INTRAVENOUS
  Filled 2017-04-12: qty 1000

## 2017-04-12 MED ORDER — ONDANSETRON HCL 4 MG/2ML IJ SOLN
4.0000 mg | Freq: Four times a day (QID) | INTRAMUSCULAR | Status: DC | PRN
  Administered 2017-04-12: 4 mg via INTRAVENOUS
  Filled 2017-04-12: qty 2

## 2017-04-12 MED ORDER — TERBUTALINE SULFATE 1 MG/ML IJ SOLN
0.2500 mg | Freq: Once | INTRAMUSCULAR | Status: DC | PRN
  Filled 2017-04-12: qty 1

## 2017-04-12 MED ORDER — FENTANYL 2.5 MCG/ML BUPIVACAINE 1/10 % EPIDURAL INFUSION (WH - ANES)
14.0000 mL/h | INTRAMUSCULAR | Status: DC | PRN
  Administered 2017-04-12: 14 mL/h via EPIDURAL
  Administered 2017-04-12: 12 mL/h via EPIDURAL
  Filled 2017-04-12 (×2): qty 100

## 2017-04-12 MED ORDER — SOD CITRATE-CITRIC ACID 500-334 MG/5ML PO SOLN: 30.0000 mL | ORAL | Status: DC | PRN

## 2017-04-12 MED ORDER — MISOPROSTOL 25 MCG QUARTER TABLET
25.0000 ug | ORAL_TABLET | ORAL | Status: DC | PRN
  Administered 2017-04-12 (×2): 25 ug via VAGINAL
  Filled 2017-04-12 (×3): qty 1

## 2017-04-12 MED ORDER — FENTANYL CITRATE (PF) 100 MCG/2ML IJ SOLN
INTRAMUSCULAR | Status: AC
  Administered 2017-04-12: 100 ug via EPIDURAL
  Filled 2017-04-12: qty 2

## 2017-04-12 MED ORDER — LIDOCAINE HCL (PF) 1 % IJ SOLN
INTRAMUSCULAR | Status: DC | PRN
  Administered 2017-04-12 (×2): 5 mL via EPIDURAL

## 2017-04-12 MED ORDER — FENTANYL CITRATE (PF) 100 MCG/2ML IJ SOLN
INTRAMUSCULAR | Status: DC | PRN
  Administered 2017-04-12 (×2): 50 ug via EPIDURAL

## 2017-04-12 MED ORDER — OXYTOCIN 40 UNITS IN LACTATED RINGERS INFUSION - SIMPLE MED: 2.5000 [IU]/h | INTRAVENOUS | Status: DC

## 2017-04-12 MED ORDER — OXYTOCIN BOLUS FROM INFUSION
500.0000 mL | Freq: Once | INTRAVENOUS | Status: AC
  Administered 2017-04-12: 500 mL via INTRAVENOUS

## 2017-04-12 MED ORDER — FENTANYL CITRATE (PF) 100 MCG/2ML IJ SOLN
100.0000 ug | Freq: Once | INTRAMUSCULAR | Status: AC
  Administered 2017-04-12: 100 ug via EPIDURAL

## 2017-04-12 MED ORDER — LIDOCAINE HCL (PF) 1 % IJ SOLN
30.0000 mL | INTRAMUSCULAR | Status: DC | PRN
  Filled 2017-04-12: qty 30

## 2017-04-12 MED ORDER — LACTATED RINGERS IV SOLN: 500.0000 mL | INTRAVENOUS | Status: DC | PRN

## 2017-04-12 NOTE — Anesthesia Pain Management Evaluation Note (Signed)
  CRNA Pain Management Visit Note  Patient: [name], 32 y.o., female  "Hello I am a member of the anesthesia team at [facility]. We have an anesthesia team available at all times to provide care throughout the hospital, including epidural management and anesthesia for C-section. I don't know your plan for the delivery whether it a natural birth, water birth, IV sedation, nitrous supplementation, doula or epidural, but we want to meet your pain goals."   1.Was your pain managed to your expectations on prior hospitalizations?   Yes   2.What is your expectation for pain management during this hospitalization?     Epidural  3.How can we help you reach that goal? Epidural when appropriate  Record the patient's initial score and the patient's pain goal.   Pain: 4  Pain Goal: 7 [facility] wants you to be able to say your pain was always managed very well.  Cleda ClarksBrowder, Kristina [name] R 04/12/2017

## 2017-04-12 NOTE — Anesthesia Preprocedure Evaluation (Addendum)
Anesthesia Evaluation  Patient identified by MRN, date of birth, ID band Patient awake    Reviewed: Allergy & Precautions, NPO status , Patient's Chart, lab work & pertinent test results  History of Anesthesia Complications Negative for: history of anesthetic complications  Airway Mallampati: II  TM Distance: >3 FB Neck ROM: Full    Dental  (+) Dental Advisory Given   Pulmonary asthma (rarely uses inhaler) ,    breath sounds clear to auscultation       Cardiovascular negative cardio ROS   Rhythm:Regular Rate:Normal     Neuro/Psych negative neurological ROS     GI/Hepatic GERD  ,Cholestasis   Endo/Other  negative endocrine ROS  Renal/GU negative Renal ROS     Musculoskeletal   Abdominal   Peds  Hematology plt 135k   Anesthesia Other Findings   Reproductive/Obstetrics (+) Pregnancy                             Anesthesia Physical Anesthesia Plan  ASA: III  Anesthesia Plan: Epidural   Post-op Pain Management:    Induction:   PONV Risk Score and Plan: 2 and Treatment may vary due to age or medical condition  Airway Management Planned: Natural Airway  Additional Equipment:   Intra-op Plan:   Post-operative Plan:   Informed Consent: I have reviewed the patients History and Physical, chart, labs and discussed the procedure including the risks, benefits and alternatives for the proposed anesthesia with the patient or authorized representative who has indicated his/her understanding and acceptance.   Dental advisory given  Plan Discussed with:   Anesthesia Plan Comments: (Patient identified. Risks/Benefits/Options discussed with patient including but not limited to bleeding, infection, nerve damage, paralysis, failed block, incomplete pain control, headache, blood pressure changes, nausea, vomiting, reactions to medication both or allergic, itching and postpartum back pain.  Confirmed with bedside nurse the patient's most recent platelet count. Confirmed with patient that they are not currently taking any anticoagulation, have any bleeding history or any family history of bleeding disorders. Patient expressed understanding and wished to proceed. All questions were answered. )        Anesthesia Quick Evaluation

## 2017-04-12 NOTE — H&P (Signed)
32 y.o. 3284w0d  [id number] comes in for induction at term for cholestasis of pregnancy.  Otherwise has good fetal movement and no bleeding.  Past Medical History:  Diagnosis Date  . Asthma    mild with cold symptoms  . GERD (gastroesophageal reflux disease)    no symptoms over 1 yr  . Hx of varicella   . Irregular bleeding 05/06/2013  . Menometrorrhagia     Past Surgical History:  Procedure Laterality Date  . DILATION AND CURETTAGE OF UTERUS    . DILATION AND EVACUATION N/A 09/01/2013   Procedure: DILATATION AND EVACUATION;  Surgeon: Bennye Almracy H Lathrop, MD;  Location: [facility];  Service: Gynecology;  Laterality: N/A;  . TONSILLECTOMY    . TONSILLECTOMY AND ADENOIDECTOMY     Just adenoids removed  . UPPER GI ENDOSCOPY    . WISDOM TOOTH EXTRACTION  2003    OB History  Gravida Para Term Preterm AB Living  4 2 1 1 1 1   SAB TAB Ectopic Multiple Live Births  1     0 1    # Outcome Date GA Lbr Len/2nd Weight Sex Delivery Anes PTL Lv  4 Current           3 Preterm 03/05/16 5930w1d 09:20 / 00:26 6 lb 1.5 oz (2.765 kg) M Vag-Spont EPI  FD  2 Term 08/08/14 6038w6d 12:17 / 01:36 7 lb 7.6 oz (3.39 kg) M Vag-Spont EPI  LIV     Birth Comments: none  1 SAB 2015 1577w0d             Social History   Socioeconomic History  . Marital status: Married    Spouse name: Not on file  . Number of children: Not on file  . Years of education: Not on file  . Highest education level: Not on file  Social Needs  . Financial resource strain: Not on file  . Food insecurity - worry: Not on file  . Food insecurity - inability: Not on file  . Transportation needs - medical: Not on file  . Transportation needs - non-medical: Not on file  Occupational History  . Not on file  Tobacco Use  . Smoking status: Never Smoker  . Smokeless tobacco: Never Used  Substance and Sexual Activity  . Alcohol use: Yes    Alcohol/week: 0.5 oz    Types: 1 Standard drinks or equivalent per week    Comment: rare  . Drug use: No  .  Sexual activity: Yes    Partners: Male    Birth control/protection: None  Other Topics Concern  . Not on file  Social History Narrative  . Not on file   Patient has no known allergies.    Prenatal Transfer Tool  Maternal Diabetes: No Genetic Screening: Declined Maternal Ultrasounds/Referrals: Normal Fetal Ultrasounds or other Referrals:  None Maternal Substance Abuse:  No Significant Maternal Medications:  Meds include: Other: Ursodiol Significant Maternal Lab Results: Lab values include: Group B Strep negative, Other: Bile acids at 34 weeks were mildly elevated.    Other PNC: Pt began ANT at 34 weeks for hx of IUFD and then had biweekly testing for cholestasis.  EFW at 34 weeks was 36%ile.    Vitals:   04/12/17 0401 04/12/17 0511 04/12/17 0601 04/12/17 0702  BP: (!) 95/53 (!) 100/49 (!) 93/59 (!) 92/53  Pulse: 65 65 70 70  Resp: 17 18 17 18   Temp:  97.9 F (36.6 C)    TempSrc:  Oral  Weight:      Height:        Lungs/Cor:  NAD Abdomen:  soft, gravid Ex:  no cords, erythema SVE:  1/thick/-3 FHTs:  130s, good STV, NST R Toco:  q 5-10   A/P   Term induction for cholestasis of pregnancy.  GBS neg.    Masey Scheiber A

## 2017-04-12 NOTE — Anesthesia Procedure Notes (Signed)
Epidural Patient location during procedure: OB Start time: 04/12/2017 3:02 PM End time: 04/12/2017 3:25 PM  Staffing Anesthesiologist: Jairo BenJackson, Sander Remedios, MD Performed: anesthesiologist   Preanesthetic Checklist Completed: patient identified, surgical consent, pre-op evaluation, timeout performed, IV checked, risks and benefits discussed and monitors and equipment checked  Epidural Patient position: sitting Prep: site prepped and draped and DuraPrep Patient monitoring: blood pressure, continuous pulse ox and heart rate Approach: midline Location: L3-L4 Injection technique: LOR air  Needle:  Needle type: Tuohy  Needle gauge: 17 G Needle length: 9 cm Needle insertion depth: 4 cm Catheter type: closed end flexible Catheter size: 19 Gauge Catheter at skin depth: 9 cm Test dose: negative (1% lidocaine)  Assessment Events: blood not aspirated, injection not painful, no injection resistance, negative IV test and no paresthesia  Additional Notes Pt identified in Labor room.  Monitors applied. Working IV access confirmed. Sterile prep, drape lumbar spine.  1% lido local L 3,4.  #17ga Touhy LOR air at 4 cm L 3,4, cath in easily to 9 cm skin. Test dose OK, cath dosed and infusion begun.  Patient asymptomatic, VSS, no heme aspirated, tolerated well.  Sandford Craze Cloris Flippo, MDReason for block:procedure for pain

## 2017-04-13 ENCOUNTER — Encounter (HOSPITAL_COMMUNITY): Payer: Self-pay

## 2017-04-13 LAB — CBC
HCT: 32.4 % — ABNORMAL LOW (ref 36.0–46.0)
HEMATOCRIT: 31.8 % — AB (ref 36.0–46.0)
HEMOGLOBIN: 11.1 g/dL — AB (ref 12.0–15.0)
Hemoglobin: 11.1 g/dL — ABNORMAL LOW (ref 12.0–15.0)
MCH: 30.4 pg (ref 26.0–34.0)
MCH: 30 pg (ref 26.0–34.0)
MCHC: 34.9 g/dL (ref 30.0–36.0)
MCHC: 34.3 g/dL (ref 30.0–36.0)
MCV: 87.1 fL (ref 78.0–100.0)
MCV: 87.6 fL (ref 78.0–100.0)
Platelets: 115 10*3/uL — ABNORMAL LOW (ref 150–400)
Platelets: 116 10*3/uL — ABNORMAL LOW (ref 150–400)
RBC: 3.65 MIL/uL — ABNORMAL LOW (ref 3.87–5.11)
RBC: 3.7 MIL/uL — AB (ref 3.87–5.11)
RDW: 13.2 % (ref 11.5–15.5)
RDW: 13.2 % (ref 11.5–15.5)
WBC: 11.8 10*3/uL — AB (ref 4.0–10.5)
WBC: 9.5 10*3/uL (ref 4.0–10.5)

## 2017-04-13 MED ORDER — ONDANSETRON HCL 4 MG/2ML IJ SOLN: 4.0000 mg | INTRAMUSCULAR | Status: DC | PRN

## 2017-04-13 MED ORDER — SENNOSIDES-DOCUSATE SODIUM 8.6-50 MG PO TABS: 2.0000 | ORAL_TABLET | ORAL | Status: DC

## 2017-04-13 MED ORDER — ALBUTEROL SULFATE (2.5 MG/3ML) 0.083% IN NEBU: 3.0000 mL | INHALATION_SOLUTION | Freq: Four times a day (QID) | RESPIRATORY_TRACT | Status: DC | PRN

## 2017-04-13 MED ORDER — IBUPROFEN 800 MG PO TABS
800.0000 mg | ORAL_TABLET | Freq: Three times a day (TID) | ORAL | Status: DC
  Administered 2017-04-13 – 2017-04-14 (×4): 800 mg via ORAL
  Filled 2017-04-13 (×4): qty 1

## 2017-04-13 MED ORDER — ONDANSETRON HCL 4 MG PO TABS: 4.0000 mg | ORAL_TABLET | ORAL | Status: DC | PRN

## 2017-04-13 MED ORDER — SIMETHICONE 80 MG PO CHEW: 80.0000 mg | CHEWABLE_TABLET | ORAL | Status: DC | PRN

## 2017-04-13 MED ORDER — ACETAMINOPHEN 325 MG PO TABS
650.0000 mg | ORAL_TABLET | ORAL | Status: DC | PRN
  Administered 2017-04-13: 650 mg via ORAL
  Filled 2017-04-13: qty 2

## 2017-04-13 MED ORDER — BENZOCAINE-MENTHOL 20-0.5 % EX AERO: 1.0000 "application " | INHALATION_SPRAY | CUTANEOUS | Status: DC | PRN

## 2017-04-13 MED ORDER — TETANUS-DIPHTH-ACELL PERTUSSIS 5-2.5-18.5 LF-MCG/0.5 IM SUSP
0.5000 mL | Freq: Once | INTRAMUSCULAR | Status: AC
  Administered 2017-04-13: 0.5 mL via INTRAMUSCULAR
  Filled 2017-04-13: qty 0.5

## 2017-04-13 MED ORDER — MAGNESIUM HYDROXIDE 400 MG/5ML PO SUSP: 30.0000 mL | ORAL | Status: DC | PRN

## 2017-04-13 MED ORDER — MEASLES, MUMPS & RUBELLA VAC ~~LOC~~ INJ
0.5000 mL | INJECTION | Freq: Once | SUBCUTANEOUS | Status: DC
  Filled 2017-04-13: qty 0.5

## 2017-04-13 MED ORDER — SODIUM CHLORIDE 0.9% FLUSH: 3.0000 mL | Freq: Two times a day (BID) | INTRAVENOUS | Status: DC

## 2017-04-13 MED ORDER — METHYLERGONOVINE MALEATE 0.2 MG/ML IJ SOLN: 0.2000 mg | INTRAMUSCULAR | Status: DC | PRN

## 2017-04-13 MED ORDER — DIBUCAINE 1 % RE OINT: 1.0000 "application " | TOPICAL_OINTMENT | RECTAL | Status: DC | PRN

## 2017-04-13 MED ORDER — PRENATAL MULTIVITAMIN CH
1.0000 | ORAL_TABLET | Freq: Every day | ORAL | Status: DC
  Administered 2017-04-13 – 2017-04-14 (×2): 1 via ORAL
  Filled 2017-04-13 (×2): qty 1

## 2017-04-13 MED ORDER — METHYLERGONOVINE MALEATE 0.2 MG PO TABS: 0.2000 mg | ORAL_TABLET | ORAL | Status: DC | PRN

## 2017-04-13 MED ORDER — COCONUT OIL OIL: 1.0000 "application " | TOPICAL_OIL | Status: DC | PRN

## 2017-04-13 MED ORDER — WITCH HAZEL-GLYCERIN EX PADS: 1.0000 "application " | MEDICATED_PAD | CUTANEOUS | Status: DC | PRN

## 2017-04-13 MED ORDER — DIPHENHYDRAMINE HCL 25 MG PO CAPS: 25.0000 mg | ORAL_CAPSULE | Freq: Four times a day (QID) | ORAL | Status: DC | PRN

## 2017-04-13 MED ORDER — FERROUS SULFATE 325 (65 FE) MG PO TABS
325.0000 mg | ORAL_TABLET | Freq: Two times a day (BID) | ORAL | Status: DC
  Administered 2017-04-13 – 2017-04-14 (×3): 325 mg via ORAL
  Filled 2017-04-13 (×3): qty 1

## 2017-04-13 MED ORDER — OXYCODONE-ACETAMINOPHEN 5-325 MG PO TABS: 2.0000 | ORAL_TABLET | ORAL | Status: DC | PRN

## 2017-04-13 MED ORDER — SODIUM CHLORIDE 0.9 % IV SOLN: 250.0000 mL | INTRAVENOUS | Status: DC | PRN

## 2017-04-13 MED ORDER — ZOLPIDEM TARTRATE 5 MG PO TABS: 5.0000 mg | ORAL_TABLET | Freq: Every evening | ORAL | Status: DC | PRN

## 2017-04-13 MED ORDER — SODIUM CHLORIDE 0.9% FLUSH: 3.0000 mL | INTRAVENOUS | Status: DC | PRN

## 2017-04-13 NOTE — Lactation Note (Addendum)
This note was copied from a baby's chart. Lactation Consultation Note  Patient Name: Kristina [name]: 04/13/2017 Reason for consult: Initial assessment;Early term 37-38.6wks;Infant < 6lbs  17 hours old female who is being exclusively BF by her mother, she's a P2. Mom was concern about her milk supply because she has a stillbirth in February 2018. Reassured her that each pregnancy is different and so it is each BF experience, she was able to BF her oldest child for 7 1/2 months, but had supply issues after she got her period.  Nipples are intact upon examination, mom also states that feedings at the breast are comfortable. Assisted with latch, mom needed minimal assistance, the only things LC had to correct was supporting baby's head in a cross cradle position (instead of cradle) to achieve a deeper latch and do some breast compressions to keep baby awake throughout the feeding. Per mom baby hasn't been able to have longer than 10 minutes sessions at the breast. Baby latched on easily and was able to sustain the latch but no swallows were heard, although jaw movement had a rhythmical pattern.  Gave some tips to keep baby interested at the breast. If baby is still no able to get 15 minutes feedings and is difficult to arouse/feed every 3 hours, mom will hand express and spoon feed that fresh expressed colostrum to baby. Will keep feeding on cues 8-12/24 hours STS.   Reviewed caring for your late preterm baby, BF brochure, BF resources and feeding diary. Mom is aware of LC services and will call PRN.  Maternal Data Has patient been taught Hand Expression?: Yes Does the patient have breastfeeding experience prior to this delivery?: Yes  Feeding Feeding Type: Breast Fed Length of feed: 15 min(baby still nursing when exiting the room)  LATCH Score Latch: Grasps breast easily, tongue down, lips flanged, rhythmical sucking.  Audible Swallowing: None  Type of Nipple: Everted at  rest and after stimulation  Comfort (Breast/Nipple): Soft / non-tender  Hold (Positioning): Assistance needed to correctly position infant at breast and maintain latch.  LATCH Score: 7  Interventions Interventions: Breast feeding basics reviewed;Assisted with latch;Skin to skin;Breast massage;Breast compression;Adjust position;Support pillows;Position options  Lactation Tools Discussed/Used WIC Program: No   Consult Status Consult Status: Follow-up [name]: 04/14/17 Follow-up type: In-patient    Harshan Kearley Venetia ConstableS Derrica Sieg 04/13/2017, 4:48 PM

## 2017-04-13 NOTE — Lactation Note (Signed)
This note was copied from a baby's chart. Lactation Consultation Note  Patient Name: Kristina [name] Today's Date: 04/13/2017   Initial visit attempted at 15 hours of life, but Mom was sleeping.   Lurline HareRichey, Sharlot Sturkey [facility] 04/13/2017, 2:48 PM

## 2017-04-13 NOTE — Anesthesia Postprocedure Evaluation (Signed)
Anesthesia Post Note  Patient: Kristina [name]  Procedure(s) Performed: AN AD HOC LABOR EPIDURAL     Anesthesia Type: Epidural Level of consciousness: awake and alert Pain management: pain level controlled Vital Signs Assessment: post-procedure vital signs reviewed and stable Respiratory status: spontaneous breathing Cardiovascular status: blood pressure returned to baseline Postop Assessment: no headache, patient able to bend at knees, no backache, no apparent nausea or vomiting, epidural receding and adequate PO intake Anesthetic complications: no    Last Vitals:  Vitals:   04/13/17 0206 04/13/17 0530  BP: (!) 97/54 (!) 98/54  Pulse: 67 64  Resp: 16 16  Temp: 37 C 36.9 C  SpO2:      Last Pain:  Vitals:   04/13/17 0720  TempSrc:   PainSc: 0-No pain   Pain Goal:                 [name], [name]

## 2017-04-13 NOTE — Progress Notes (Signed)
Patient is eating, ambulating, voiding.  Pain control is good.  Appropriate lochia, no complaints.  Vitals:   04/13/17 0046 04/13/17 0105 04/13/17 0206 04/13/17 0530  BP: (!) 99/54 (!) 103/55 (!) 97/54 (!) 98/54  Pulse: 76 66 67 64  Resp: 18 16 16 16   Temp:  98.3 F (36.8 C) 98.6 F (37 C) 98.4 F (36.9 C)  TempSrc:  Oral Oral Oral  SpO2:      Weight:      Height:        Fundus firm Abd: soft, NT Ext: no calf tenderness  Lab Results  Component Value Date   WBC 9.5 04/13/2017   HGB 11.1 (L) 04/13/2017   HCT 32.4 (L) 04/13/2017   MCV 87.6 04/13/2017   PLT 116 (L) 04/13/2017    --/--/AB POS (03/14 0057)  A/P Post partum day #1 Doing well  Routine care.      Philip AspenALLAHAN, Afnan Cadiente

## 2017-04-13 NOTE — Progress Notes (Signed)
CSW spoke with bedside regarding meeting th MOB regarding IUFD.  Per bedside nurse, MOB is not interested in speaking with CSW at this time.   Please contact the Clinical Social Worker if needs arise, by MOB request, or if MOB scores greater than 9/yes to question 10 on Edinburgh Postpartum Depression Screen.  CSW identifies no further need for intervention and no barriers to discharge at this time.  Saul Dorsi Boyd-Gilyard, MSW, LCSW Clinical Social Work [phone]  

## 2017-04-14 LAB — BIRTH TISSUE RECOVERY COLLECTION (PLACENTA DONATION)

## 2017-04-14 NOTE — Discharge Summary (Signed)
Obstetric Discharge Summary Reason for Admission: induction of labor and cholestasis of pregnancy Prenatal Procedures: NST and ultrasound Intrapartum Procedures: spontaneous vaginal delivery Postpartum Procedures: none Complications-Operative and Postpartum: none Hemoglobin  Date Value Ref Range Status  04/13/2017 11.1 (L) 12.0 - 15.0 g/dL Final   Hemoglobin, fingerstick  Date Value Ref Range Status  05/06/2013 12.5 12.0 - 16.0 g/dL Final   HCT  Date Value Ref Range Status  04/13/2017 32.4 (L) 36.0 - 46.0 % Final    Physical Exam:  General: alert and cooperative Lochia: appropriate Uterine Fundus: firm DVT Evaluation: No evidence of DVT seen on physical exam.  Discharge Diagnoses: Term Pregnancy-delivered  Discharge Information: Date: 04/14/2017 Activity: pelvic rest Diet: routine Medications: PNV and Ibuprofen Condition: stable Instructions: refer to practice specific booklet Discharge to: home Follow-up Information    Kristina ClampHorvath, Michelle, MD Follow up in 4 week(s).   Specialty:  Obstetrics and Gynecology Contact information: [address] [name] [address] [phone]           Newborn Data: Live born female  Birth Weight: 5 lb 12.8 oz (2630 g) APGAR: 8, 9  Newborn Delivery   Birth date/time:  04/12/2017 23:36:00 Delivery type:  Vaginal, Spontaneous     Home with mother.  Kristina [name], Kristina [name] 04/14/2017, 9:48 AM

## 2017-04-14 NOTE — Lactation Note (Signed)
This note was copied from a baby's chart. Lactation Consultation Note  Patient Name: Girl Kathi LudwigMiranda Runner ZOXWR'UToday's Date: 04/14/2017  Mom states breastfeeding is going well.  She is concerned baby lost weight.  Baby has had a 7 % weight loss with 5 voids and 3 stools in life. Reassured mom and discussed normal weight loss.  Reviewed using good skin to skin and waking techniques.  Instructed on engorgement prevention and treatment.  Lactation outpatient services and support information reviewed and encouraged.   Maternal Data    Feeding Feeding Type: Breast Fed Length of feed: 15 min  LATCH Score Latch: Grasps breast easily, tongue down, lips flanged, rhythmical sucking.  Audible Swallowing: A few with stimulation  Type of Nipple: Everted at rest and after stimulation  Comfort (Breast/Nipple): Soft / non-tender  Hold (Positioning): No assistance needed to correctly position infant at breast.  LATCH Score: 9  Interventions    Lactation Tools Discussed/Used     Consult Status      Huston FoleyMOULDEN, Jossalyn Forgione S 04/14/2017, 9:17 AM

## 2017-04-15 ENCOUNTER — Ambulatory Visit: Payer: Self-pay

## 2017-04-15 NOTE — Lactation Note (Signed)
This note was copied from a baby's chart. Lactation Consultation Note  Patient Name: Kristina Kathi LudwigMiranda Hegstrom RUEAV'WToday's Date: 04/15/2017  Baby continues to feed well and good output.  Mom is post pumping and obtained 15 mls this AM  She is spoon feeding baby expressed milk.  Questions answered.  Lactation outpatient services and support reviewed and encouraged prn.   Maternal Data    Feeding Feeding Type: Breast Fed Length of feed: 15 min  LATCH Score Latch: Grasps breast easily, tongue down, lips flanged, rhythmical sucking.  Audible Swallowing: Spontaneous and intermittent  Type of Nipple: Everted at rest and after stimulation  Comfort (Breast/Nipple): Filling, red/small blisters or bruises, mild/mod discomfort  Hold (Positioning): No assistance needed to correctly position infant at breast.  LATCH Score: 9  Interventions    Lactation Tools Discussed/Used     Consult Status      Huston FoleyMOULDEN, Alise Calais S 04/15/2017, 8:58 AM

## 2017-05-13 IMAGING — US US MFM OB LIMITED
1 series · 5 of 5 positions shown · non-contrast
Comparison: none

[Series 1: us mfm ob limited · 5 acquisitions, 5 frames shown]
[im 1/5]
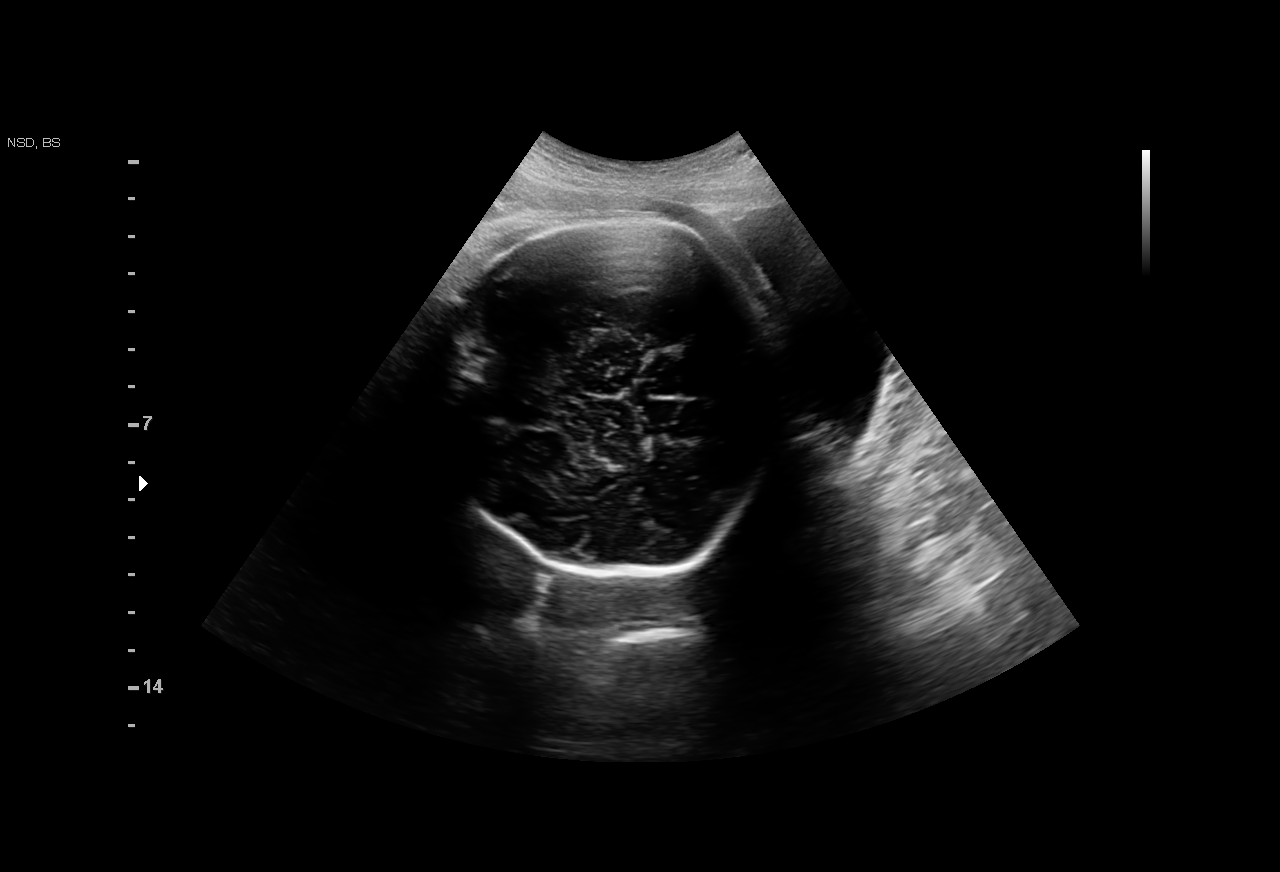
[im 2/5]
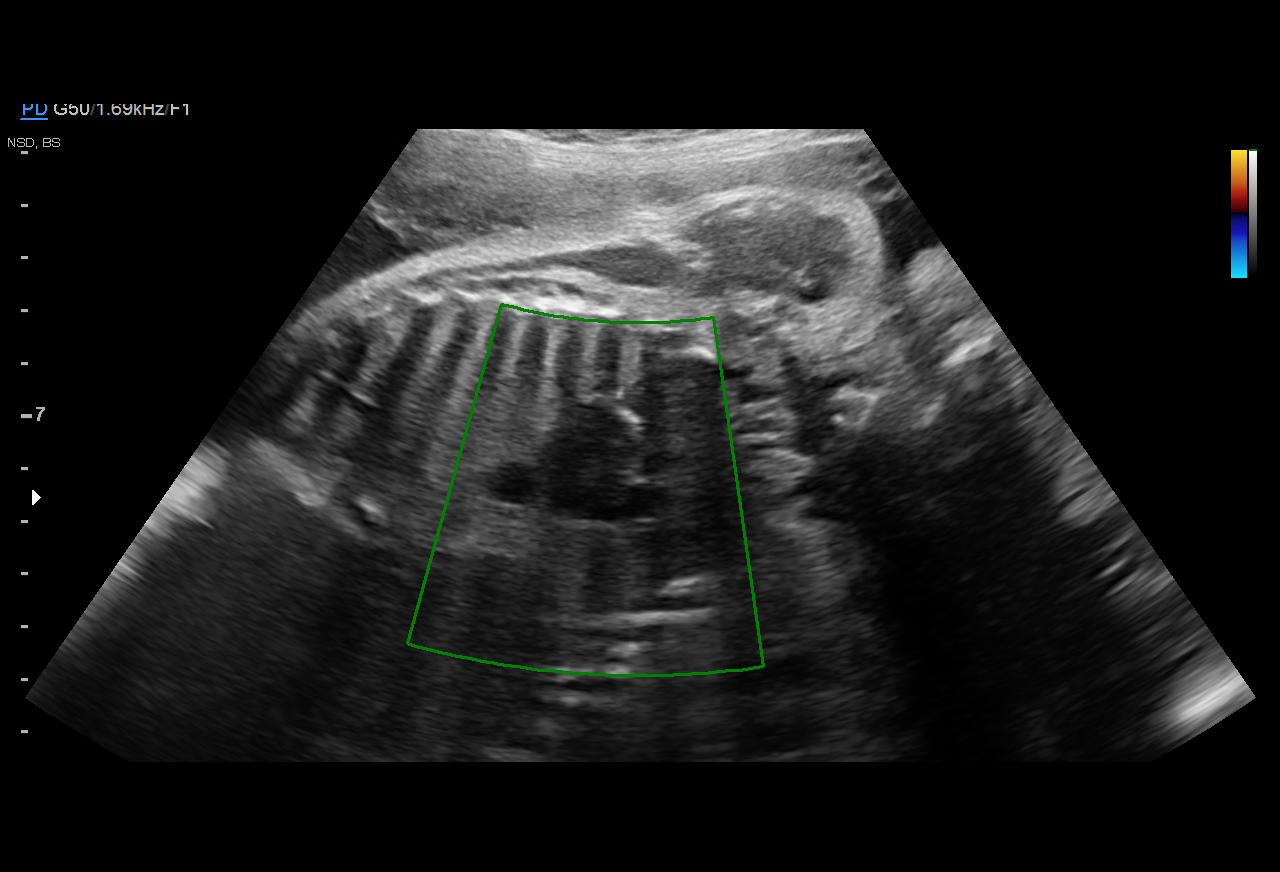
[im 3/5]
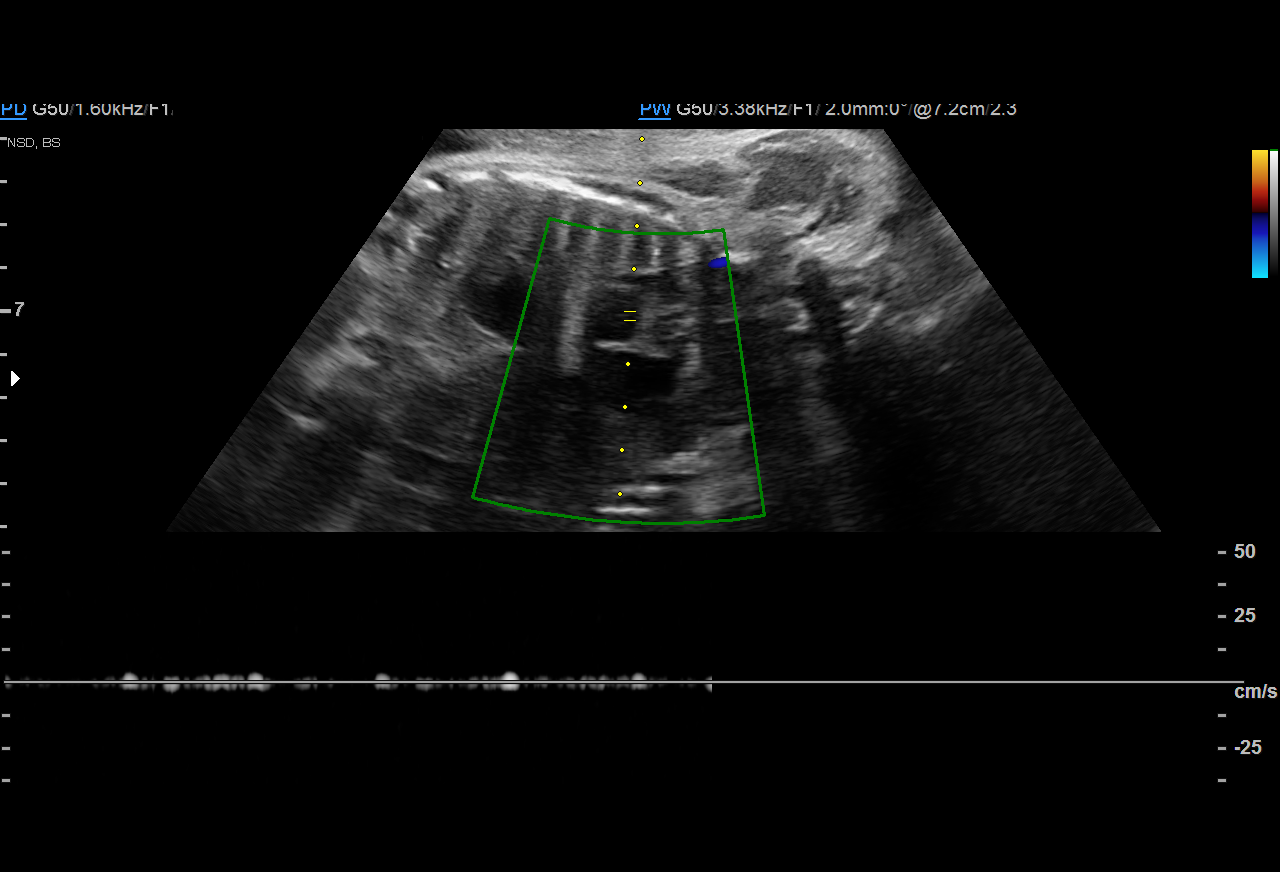
[im 4/5]
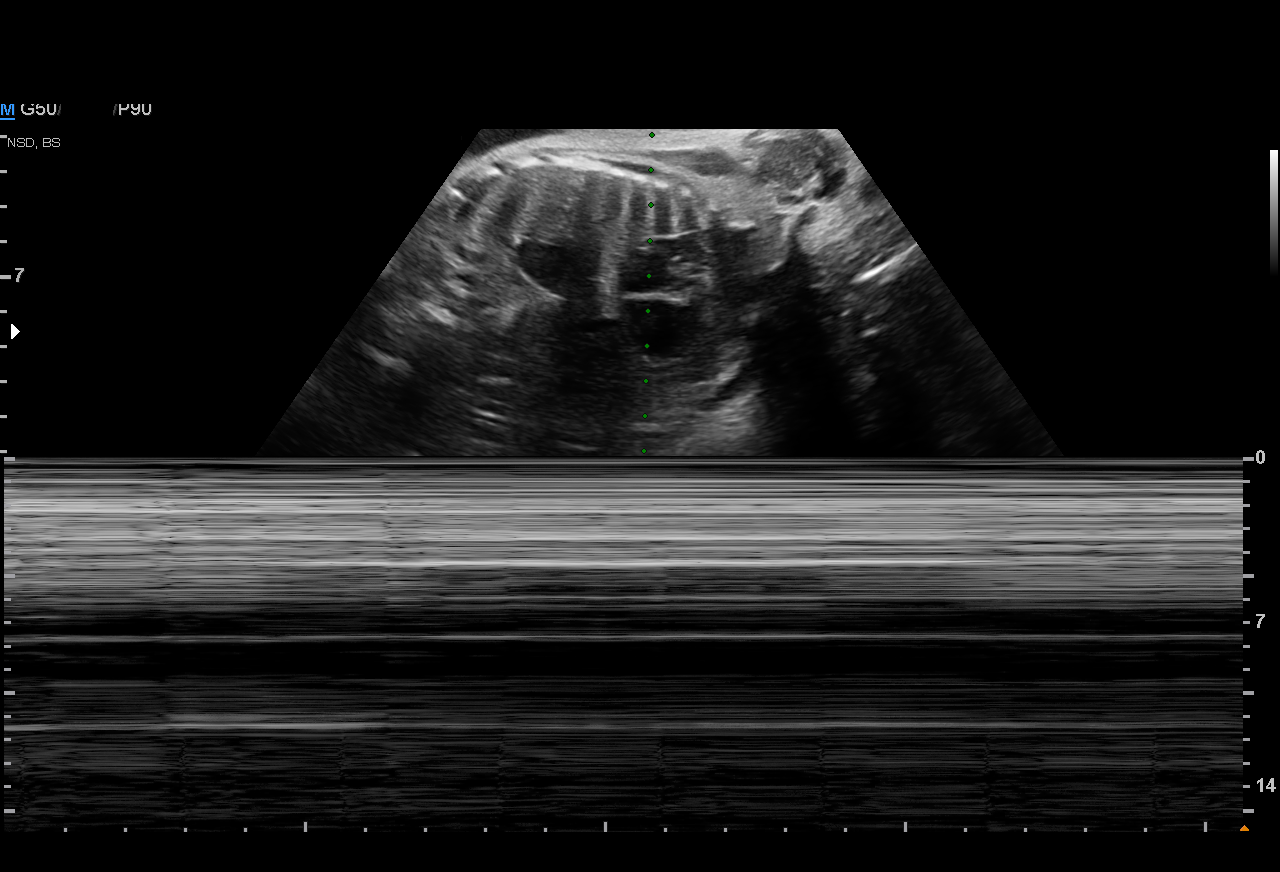
[im 5/5]
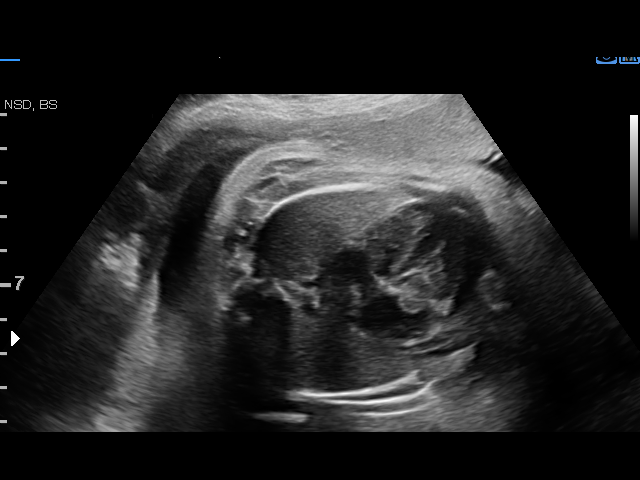

[5 of 5 positions shown; findings below may reference images not displayed]

MAU/Triage

1  PEI YIN GILLIAN           [id number]      [id number]     [id number]
Indications

36 weeks gestation of pregnancy
Decreased fetal movements, third trimester,
unspecified
Absent fetal heart tones                       O76
Fetal Evaluation

Num Of Fetuses:     1
Cardiac Activity:   Absent
Presentation:       Cephalic
Gestational Age

Clinical EDD:  36w 0d                                        EDD:   04/01/16
Best:          36w 0d     Det. By:  Clinical EDD             EDD:   04/01/16
Impression

Fetal demise at 36+0 weeks
No cardiac activity
Cephalic presentation
Recommendations

Follow-up ultrasounds as clinically indicated.

## 2023-11-02 ENCOUNTER — Ambulatory Visit

## 2023-11-02 ENCOUNTER — Ambulatory Visit (INDEPENDENT_AMBULATORY_CARE_PROVIDER_SITE_OTHER)

## 2023-11-02 VITALS — Ht 64.0 in | Wt 145.9 lb

## 2023-11-02 DIAGNOSIS — L603 Nail dystrophy: Secondary | ICD-10-CM

## 2023-11-02 DIAGNOSIS — M7752 Other enthesopathy of left foot: Secondary | ICD-10-CM

## 2023-11-02 DIAGNOSIS — L600 Ingrowing nail: Secondary | ICD-10-CM

## 2023-11-02 NOTE — Patient Instructions (Signed)

## 2023-11-02 NOTE — Progress Notes (Signed)
 Subjective:  Patient ID: Kristina [name], female    DOB: 1986-01-06,  MRN: [id number]  Chief Complaint  Patient presents with   Nail Problem    RM 5 Patient is here for left hallux nail discoloration and pain. Pain started on the medial side of the nail bed.    38 y.o. female presents with the above complaint.  She states that her left hallux has been discolored for approximately 2 years.  It began on the medial margin of the nail and then gradually increased in size encompassing majority of the nail.  She denies any trauma to the area.  She denies any history of open wounds or signs of infection to the toe.  She states she has been to a dermatologist who cultured it and did not find fungus. She denies other pedal pathology.    Review of Systems: Negative except as noted in the HPI. Denies N/V/F/Ch.  Past Medical History:  Diagnosis Date   Asthma    mild with cold symptoms   GERD (gastroesophageal reflux disease)    no symptoms over 1 yr   Hx of varicella    Irregular bleeding 05/06/2013   Menometrorrhagia     Current Outpatient Medications:    albuterol  (PROVENTIL  HFA;VENTOLIN  HFA) 108 (90 BASE) MCG/ACT inhaler, Inhale into the lungs every 6 (six) hours as needed for wheezing or shortness of breath., Disp: , Rfl:    ibuprofen  (ADVIL ,MOTRIN ) 600 MG tablet, Take 1 tablet (600 mg total) by mouth every 6 (six) hours. (Patient not taking: Reported on 04/12/2017), Disp: 30 tablet, Rfl: 0   Prenatal Vit-Fe Fumarate-FA (PRENATAL MULTIVITAMIN) TABS tablet, Take 1 tablet by mouth daily at 12 noon. , Disp: , Rfl:   Social History   Tobacco Use  Smoking Status Never  Smokeless Tobacco Never    No Known Allergies Objective:  There were no vitals filed for this visit. Body mass index is 25.04 kg/m. Constitutional Well developed. Well nourished.  Vascular Dorsalis pedis pulses palpable bilaterally. Posterior tibial pulses palpable bilaterally. Capillary refill normal to all digits.  No  cyanosis or clubbing noted. Pedal hair growth normal.  Neurologic Normal speech. Oriented to person, place, and time. Epicritic sensation to light touch grossly present bilaterally.  Dermatologic Painful ingrowing nail at bilateral nail borders of the hallux nail left.  There is minimal to moderate thickening with discoloration of the vast majority of the nail.  There is a thin central longitudinal line that is still clear, healthy appearing nail. No other open wounds. No skin lesions.  Orthopedic: Normal joint ROM without pain or crepitus bilaterally. No visible deformities. No bony tenderness.   Radiographs: 3 weightbearing views of the left foot were acquired today.  These do not show any acute osseous fracture, dislocation or other.  There is a slight irregularity in the proximal medial cortex of the distal phalanx, only visualized on AP view. 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  No dorsal prominence on the distal phalanx.  Assessment:   1. Capsulitis of metatarsophalangeal (MTP) joint of left foot   2. Ingrown left big toenail   3. Nail dystrophy    Plan:  Patient was evaluated and treated and all questions answered.  Ingrown Nail, left -Discussed treatment options ranging from surgical to conservative along with risks and benefits of each. Patient expresses understanding. Patient elects to proceed with minor surgery to remove ingrown toenail removal today. Consent reviewed and signed by patient. -We discussed options for her chronically dystrophic nail today.  We  elected to proceed with total nail avulsion without chemical matrixectomy.  We did discuss that it may grow back in a similar way, she expresses understanding of this and wishes to proceed. If it does grow back in dystrophic manner we will likely proceed with chermical matrixectomy permanent nail avulsion.  -Due to chronic nature of nail dystrophy with unknown etiology, x-ray was ordered to rule out underlying  pathology. -Ingrown nail excised. See procedure note. -Educated on post-procedure care including soaking. Written instructions provided and reviewed. -Patient to follow up in 2 weeks for nail check.  Procedure: Excision of Ingrown Toenail Location: Left 1st toe whole nail. Anesthesia: Lidocaine  1% plain; 1.5 mL and Marcaine  0.5% plain; 1.5 mL, digital block. Skin Prep: Betadine. Dressing: Silvadene; telfa; dry, sterile, compression dressing. Technique: Following skin prep, the toe was exsanguinated and a tourniquet was secured at the base of the toe. The affected nail was freed from underlying and surrounding tissue. It was grasped with a hemostat and excised from the toe. The area was irrigated out with alcohol. The tourniquet was then removed and sterile dressing applied. Disposition: Patient tolerated procedure well. Patient to return in 2 weeks for follow-up.  Post op care: Keep dressing clean, dry, and intact for 24 hours before removing. Soak operative foot and redress BID as printed instructions describe. Patient educated on signs of infection, pt expresses understanding and will proceed for prompt medical care if signs of infection arise.   Return in about 2 weeks (around 11/16/2023).  Prentice Ovens, DPM AACFAS Fellowship Trained Podiatric Surgeon [facility]

## 2023-11-16 ENCOUNTER — Ambulatory Visit
